# Patient Record
Sex: Male | Born: 1955 | Race: White | Hispanic: No | Marital: Married | Smoking: Never smoker
Health system: Southern US, Community
[De-identification: ages and names within clinical notes are randomized; demographics above are authoritative.]

## PROBLEM LIST (undated history)

## (undated) DIAGNOSIS — E119 Type 2 diabetes mellitus without complications: Secondary | ICD-10-CM

## (undated) DIAGNOSIS — M25559 Pain in unspecified hip: Secondary | ICD-10-CM

## (undated) DIAGNOSIS — I10 Essential (primary) hypertension: Secondary | ICD-10-CM

## (undated) DIAGNOSIS — M5430 Sciatica, unspecified side: Secondary | ICD-10-CM

## (undated) DIAGNOSIS — I219 Acute myocardial infarction, unspecified: Secondary | ICD-10-CM

## (undated) DIAGNOSIS — M549 Dorsalgia, unspecified: Secondary | ICD-10-CM

## (undated) DIAGNOSIS — E7800 Pure hypercholesterolemia, unspecified: Secondary | ICD-10-CM

## (undated) DIAGNOSIS — G8929 Other chronic pain: Secondary | ICD-10-CM

## (undated) HISTORY — DX: Pure hypercholesterolemia, unspecified: E78.00

## (undated) HISTORY — DX: Type 2 diabetes mellitus without complications: E11.9

## (undated) HISTORY — PX: OTHER SURGICAL HISTORY: SHX169

---

## 2003-01-10 ENCOUNTER — Emergency Department (HOSPITAL_COMMUNITY): Admission: EM | Admit: 2003-01-10 | Discharge: 2003-01-10 | Payer: Self-pay | Admitting: Emergency Medicine

## 2004-06-24 ENCOUNTER — Emergency Department (HOSPITAL_COMMUNITY): Admission: EM | Admit: 2004-06-24 | Discharge: 2004-06-24 | Payer: Self-pay | Admitting: Emergency Medicine

## 2008-05-13 ENCOUNTER — Inpatient Hospital Stay (HOSPITAL_COMMUNITY): Admission: AD | Admit: 2008-05-13 | Discharge: 2008-05-15 | Payer: Self-pay | Admitting: Interventional Cardiology

## 2008-05-13 ENCOUNTER — Other Ambulatory Visit: Payer: Self-pay | Admitting: Emergency Medicine

## 2008-06-24 ENCOUNTER — Encounter (HOSPITAL_COMMUNITY): Admission: RE | Admit: 2008-06-24 | Discharge: 2008-09-22 | Payer: Self-pay | Admitting: Interventional Cardiology

## 2008-09-23 ENCOUNTER — Encounter (HOSPITAL_COMMUNITY): Admission: RE | Admit: 2008-09-23 | Discharge: 2008-12-22 | Payer: Self-pay | Admitting: Interventional Cardiology

## 2008-10-01 ENCOUNTER — Ambulatory Visit (HOSPITAL_COMMUNITY): Admission: RE | Admit: 2008-10-01 | Discharge: 2008-10-01 | Payer: Self-pay | Admitting: Internal Medicine

## 2008-10-04 ENCOUNTER — Ambulatory Visit: Payer: Self-pay | Admitting: Orthopedic Surgery

## 2008-10-04 ENCOUNTER — Encounter (INDEPENDENT_AMBULATORY_CARE_PROVIDER_SITE_OTHER): Payer: Self-pay | Admitting: *Deleted

## 2008-10-04 DIAGNOSIS — M549 Dorsalgia, unspecified: Secondary | ICD-10-CM | POA: Insufficient documentation

## 2008-10-04 DIAGNOSIS — M5430 Sciatica, unspecified side: Secondary | ICD-10-CM | POA: Insufficient documentation

## 2008-10-06 ENCOUNTER — Encounter: Payer: Self-pay | Admitting: Orthopedic Surgery

## 2008-10-15 ENCOUNTER — Encounter (INDEPENDENT_AMBULATORY_CARE_PROVIDER_SITE_OTHER): Payer: Self-pay | Admitting: *Deleted

## 2008-10-20 ENCOUNTER — Telehealth: Payer: Self-pay | Admitting: Orthopedic Surgery

## 2008-11-08 ENCOUNTER — Encounter (INDEPENDENT_AMBULATORY_CARE_PROVIDER_SITE_OTHER): Payer: Self-pay | Admitting: *Deleted

## 2009-08-30 ENCOUNTER — Ambulatory Visit (HOSPITAL_COMMUNITY): Admission: RE | Admit: 2009-08-30 | Discharge: 2009-08-30 | Payer: Self-pay | Admitting: General Surgery

## 2010-06-12 ENCOUNTER — Encounter: Payer: Self-pay | Admitting: Family Medicine

## 2010-08-29 LAB — BASIC METABOLIC PANEL
CO2: 29 mEq/L (ref 19–32)
GFR calc Af Amer: >60 mL/min (ref >60)
Glucose, Bld: 112 mg/dL — ABNORMAL HIGH (ref 70–99)
Potassium: 4.2 mEq/L (ref 3.5–5.1)
Sodium: 138 mEq/L (ref 135–145)

## 2010-10-03 NOTE — Cardiovascular Report (Signed)
NAME:  Alexander Avila, Alexander Avila NO.:  [id number]   MEDICAL RECORD NO.:  [id number]           PATIENT TYPE:   LOCATION:                                 FACILITY:   PHYSICIAN:  Lyn Records, M.D.   DATE OF BIRTH:  [birth year]   DATE OF PROCEDURE:  DATE OF DISCHARGE:                            CARDIAC CATHETERIZATION   REASON FOR ADMISSION:  Acute inferior STEMI.   SUBJECTIVE:  The patient is a 55 year old gentleman who works for [employer].  He began having severe chest discomfort at 2:00 p.m.  He was  brought by [facility] to the emergency room where ST elevation was  noted in the inferior leads.  He stated that the discomfort was  associated with nausea and diaphoresis.  The pain has been unrelenting.  There is no prior history of heart disease.   HABITS:  Does not smoke.  May drink up to 6 beers per week.   MEDICATIONS:  Lisinopril and Pravachol.   ALLERGIES:  PENICILLIN.   FAMILY HISTORY:  Negative for CAD.   PREVIOUS SURGERIES:  None.   SIGNIFICANT MEDICAL PROBLEMS:  1. Hypertension  2. Hyperlipidemia.   REVIEW OF SYSTEMS:  Unremarkable.   OBJECTIVE:  VITAL SIGNS:  Blood pressure 140/100, heart rate 80.  SKIN:  Damp.  HEENT:  Unremarkable.  LUNGS:  Clear to auscultation and percussion.  CARDIAC:  No rub.  No murmur.  No click.  ABDOMEN:  Soft.  Liver and spleen not palpable.  Bowel sounds are  normal.  EXTREMITIES:  No edema.  The pulses are 2+ and symmetric.  Femorals  reveal no bruits.  NEUROLOGIC:  Unremarkable.   EKG, inferior ST elevation.   ASSESSMENT:  Acute inferior wall ST-segment elevation myocardial  infarction.   PLAN:  Cardiac catheterization laboratory immediately for reperfusion  therapy.      Lyn Records, M.D.  Electronically Signed     HWS/MEDQ  D:  05/13/2008  T:  05/14/2008  Job:  [id number]   cc:   [facility] and [facility]

## 2010-10-03 NOTE — Cardiovascular Report (Signed)
NAME:  Alexander Avila, Alexander Avila NO.:  [id number]   MEDICAL RECORD NO.:  [id number]          PATIENT TYPE:  OIB   LOCATION:  2307                         FACILITY:  [facility]   PHYSICIAN:  Lyn Records, M.D.   DATE OF BIRTH:  1955-09-14   DATE OF PROCEDURE:  05/13/2008  DATE OF DISCHARGE:                            CARDIAC CATHETERIZATION   REASON FOR PROCEDURE:  Acute inferior wall STEMI starting at 2 p.m.  The  patient was brought to the emergency room with diagnostic inferior ST-  segment elevation and 5-7/10 chest pain.   PROCEDURES PERFORMED:  1. Left heart cath.  2. Selective coronary angiography.  3. Left ventriculography.  4. Drug-eluting stent implantation, distal circumflex.  5. Drug-eluting stent implantation, fourth obtuse marginal branch.   DESCRIPTION:  After initial brief evaluation in the emergency room, the  patient was brought to the cath lab where percutaneous coronary  intervention was performed.  A 6-French sheath was placed in the right  femoral artery after 1 mg of Versed and 25 mcg of fentanyl.  This 6-  [place] sheath was then used to deliver a 6-French A2 multipurpose  catheter, which was used for hemodynamic recordings, left  ventriculography by hand injection, and selective left and right  coronary angiography.  We identified the distal circumflex as a source  of the patient's acute inferior infarction.  His right coronary was  nondominant.  We also identified severe disease and a large  inferolateral obtuse marginal branch.   We used a CLS 6-French guide catheter with 3.5 cm curve.  The patient  received 0.75 mg/kg bolus followed by a 1.75 mg/kg/hour infusion.  ACT  was documented to be greater than 300 seconds.  PCI was performed to the  CLS 3.5 6-French left system guide catheter.  An Office manager was  used across the stenosis in the distal right coronary that supplied the  PDA territory.  Wire reperfusion TIMI grade 3 flow with  abrupt  resolution of ST-segment elevation occurred at 4:06 p.m.  The patient  then developed significant Bezold-Jarisch reaction with bradycardia and  severe hypotension requiring IV fluid administration and atropine.  We  did not perform any balloon inflation until the patient's hemodynamics  were stabilized.  We then performed angioplasty using a 2.5- x 12-mm  long apex balloon and then deployed a 18-mm long x 2.75-mm Xience V  stent.  We postdilated with a 12-mm long x 3.0-mm diameter Quantum  balloon.   After high-pressure postdilatation, we then turned our attention to the  fourth obtuse marginal.  We used the [employer] guidewire.  We  predilated with the same 2.5-mm x 12-mm long apex.  We deployed a 23-mm  long x 2.75-mm Xience V stent.  Post dilatation with the 12-mm long x  3.0-mm Quantum was performed in the distal, mid, and proximal stent.  Peak pressure was 13 atmospheres.  The case was then terminated and a  good visual result with TIMI grade 3 flow noted in all 3 vessels.  The  patient was comfortable throughout the majority of the procedure as his  chest pain significantly resolved and ST segments resolved after wire  reperfusion at 4 4:02 p.m.   Angio-Seal was used to achieve hemostasis.  No complications occurred  during the procedure.   RESULTS:  1. Hemodynamic data:      a.     Aortic pressure 124/86.      b.     Left ventricular pressure 129/14.  2. Left ventriculography:  Severe inferior wall hypokinesis as noted.      EF is 55%.  3. Coronary angiography.      a.     Left main coronary:  Short and widely patent.      b.     Left anterior descending coronary:  Large arising from the       near single ostium.  The LAD wraps around the left ventricular       apex.  Two small diagonal branches are noted.  Midvessel       irregularities are noted, but no significant obstruction is seen.      c.     Circumflex artery:  The circumflex also arises from a near        single/separate ostium.  It gives origin to 3 relatively small and       diffusely diseased obtuse marginal branches followed by very large       fourth obtuse marginal branch that contains a 95% stenosis.  The       continuation of the circumflex beyond the fourth obtuse marginal       is totally occluded (it is thrombotic).  The continuation of the       circumflex supplies the PDA.      d.     The right coronary:  The right coronary is nondominant that       contains severe disease in the proximal and mid segment up to 80%.       One acute marginal and a conus branch arise from the right       coronary.  4. PCI.      a.     PCI of the distal circumflex resulted in recanalization of       100% occlusion and reestablished flow into the PDA with reduction       of stenosis to 0% with TIMI grade 3 flow.  TIMI grade 3 flow,       however, was achieved with wire reperfusion at 4:02 p.m.      b.     The fourth obtuse marginal branch was successfully stented       reducing a 95% stenosis to 0% with TIMI grade 3 flow.   CONCLUSIONS:  1. Successful therapy inferior ST-elevated myocardial infarction with      drug-eluting stenting of the distal circumflex/posterior descending      artery with a Xience V drug-eluting stent.  2. Successful percutaneous coronary intervention of 95% stenosis in a      large fourth obtuse marginal to 0% with Xience V drug-eluting      stent.  3. Wide patency of the left anterior descending.  4. Severe diffuse disease in small nondominant right coronary.  5. Left ventricular dysfunction with moderate inferior wall      hypokinesis, ejection fraction of 55%.   PLAN:  Aspirin.  Plavix.  Angiomax had low infusion rate of 0.25 mg/kg/2  hours.  Plavix and aspirin will be continued for a year.  ACE inhibitor  and beta-blocker therapy will be used  as tolerated by blood pressure.      Lyn Records, M.D.  Electronically Signed     HWS/MEDQ  D:  05/13/2008  T:   05/14/2008  Job:  [id number]   cc:   [facility], [facility]

## 2011-01-14 ENCOUNTER — Emergency Department (HOSPITAL_COMMUNITY)
Admission: EM | Admit: 2011-01-14 | Discharge: 2011-01-15 | Disposition: A | Discharge status: Home / Self Care | Payer: BC Managed Care – PPO | Attending: Emergency Medicine | Admitting: Emergency Medicine

## 2011-01-14 ENCOUNTER — Other Ambulatory Visit: Payer: Self-pay

## 2011-01-14 DIAGNOSIS — I252 Old myocardial infarction: Secondary | ICD-10-CM | POA: Insufficient documentation

## 2011-01-14 DIAGNOSIS — I10 Essential (primary) hypertension: Secondary | ICD-10-CM

## 2011-01-14 HISTORY — DX: Acute myocardial infarction, unspecified: I21.9

## 2011-01-14 HISTORY — DX: Essential (primary) hypertension: I10

## 2011-01-14 LAB — CBC
HCT: 44.5 % (ref 39.0–52.0)
Hemoglobin: 15.6 g/dL (ref 13.0–17.0)
MCH: 31 pg (ref 26.0–34.0)
MCHC: 35.1 g/dL (ref 30.0–36.0)
RBC: 5.04 MIL/uL (ref 4.22–5.81)

## 2011-01-14 LAB — DIFFERENTIAL
Basophils Relative: 0 % (ref 0–1)
Lymphs Abs: 2.6 10*3/uL (ref 0.7–4.0)
Monocytes Absolute: 0.5 10*3/uL (ref 0.1–1.0)
Monocytes Relative: 7 % (ref 3–12)
Neutro Abs: 4.4 10*3/uL (ref 1.7–7.7)
Neutrophils Relative %: 57 % (ref 43–77)

## 2011-01-14 NOTE — ED Notes (Signed)
Pt denies cp, or sob. Been having spells of anxitity & anxious. Pt has episode of sweating & unable to cool down. Pt did take extra bp pill before coming to the er.

## 2011-01-14 NOTE — ED Provider Notes (Signed)
History     CSN: [id number] Arrival date & time: 01/14/2011 10:33 PM  Chief Complaint  Patient presents with  . Hypertension    hot and sweaty   HPI Comments: Pt reports 3 episodes in past month They last several hours Not exertional No syncope No Ha No visual change No cp/sob No focal weakness No abd pain He reports episodes of diaphoresis, anxiety and elevated BP.  This is the third episode in past month No arm pain.  No pallor reported  He reports that he had an MI about two yrs ago, and the symptoms at that time consisted of sweating, paleness, epigastric discomfort and left UE numbness.  He does not have any arm numbness and no abd pain these times.   He reports he is active, worked on his yard earlier today without any symptoms or weakness at that time.   He reports BP med compliance   Patient is a 55 y.o. male presenting with hypertension. The history is provided by the patient and the spouse.  Hypertension This is a recurrent problem. The current episode started less than 1 hour ago. The problem has been gradually improving. Pertinent negatives include no chest pain, no abdominal pain, no headaches and no shortness of breath. The symptoms are aggravated by nothing. The symptoms are relieved by nothing.    Past Medical History  Diagnosis Date  . Hypertension   . Heart attack     Past Surgical History  Procedure Date  . Stents     No family history on file.  History  Substance Use Topics  . Smoking status: Never Smoker   . Smokeless tobacco: Not on file  . Alcohol Use: Yes     occ      Review of Systems  Respiratory: Negative for shortness of breath.   Cardiovascular: Negative for chest pain.  Gastrointestinal: Negative for abdominal pain.  Neurological: Negative for headaches.  All other systems reviewed and are negative.    Physical Exam  BP 151/85  Pulse 82  Temp(Src) 97.8 F (36.6 C) (Oral)  Resp 16  Ht 6' (1.829 m)  Wt 255 lb (115.667  kg)  BMI 34.58 kg/m2  SpO2 98%  Physical Exam  CONSTITUTIONAL: Well developed/well nourished HEAD AND FACE: Normocephalic/atraumatic EYES: EOMI/PERRL ENMT: Mucous membranes moist NECK: supple no meningeal signs CV: S1/S2 noted, no murmurs/rubs/gallops noted LUNGS: Lungs are clear to auscultation bilaterally, no apparent distress ABDOMEN: soft, nontender, no rebound or guarding NEURO: Pt is awake/alert, moves all extremitiesx4 EXTREMITIES: pulses normal, full ROM SKIN: warm, color normal, no sweat noted on skin PSYCH: no abnormalities of mood noted   ED Course  Procedures  MDM Nursing notes reviewed and considered in documentation All labs/vitals reviewed and considered    Date: 01/14/2011  Rate: 77  Rhythm: normal sinus rhythm  QRS Axis: normal  Intervals: normal  ST/T Wave abnormalities: normal  Conduction Disutrbances:none  Narrative Interpretation:   Old EKG Reviewed: unchanged  Pt well appearing.  Patient and wife request that I run one set of cardiac markers to r/o any sort of cardiac event.  I informed them that normal EKG and cardiac marker do not completely r/o ACS, they understand this, but still want labs and to f/u with PCP tomorrow.     12:20 AM Pt well appearing, BP improved No further symptoms while in ED He will see his PCP tomorrow for further workup/evaluation He requested ativan before d/c home     Joya Gaskins, MD  01/15/11 0021 

## 2011-01-14 NOTE — ED Notes (Signed)
Pt reports having 3 episodes of feeling hot and sweaty over the last month, increased anxiety tonight bp was elevated at home.

## 2011-01-15 LAB — BASIC METABOLIC PANEL
BUN: 21 mg/dL (ref 6–23)
CO2: 27 mEq/L (ref 19–32)
Calcium: 9 mg/dL (ref 8.4–10.5)
Chloride: 104 mEq/L (ref 96–112)
Creatinine, Ser: 0.99 mg/dL (ref 0.50–1.35)
GFR calc Af Amer: >60 mL/min (ref >60)
GFR calc non Af Amer: >60 mL/min (ref >60)
Glucose, Bld: 156 mg/dL — ABNORMAL HIGH (ref 70–99)
Potassium: 3.8 mEq/L (ref 3.5–5.1)
Sodium: 139 mEq/L (ref 135–145)

## 2011-01-15 MED ORDER — LORAZEPAM 1 MG PO TABS
1.0000 mg | ORAL_TABLET | Freq: Once | ORAL | Status: AC
  Administered 2011-01-15: 1 mg via ORAL
  Filled 2011-01-15: qty 1

## 2011-02-23 LAB — DIFFERENTIAL
Basophils Relative: 0 % (ref 0–1)
Eosinophils Absolute: 0 10*3/uL (ref 0.0–0.7)
Eosinophils Relative: 0 % (ref 0–5)
Lymphs Abs: 1.8 10*3/uL (ref 0.7–4.0)

## 2011-02-23 LAB — POCT I-STAT, CHEM 8
BUN: 17 mg/dL (ref 6–23)
Chloride: 101 mEq/L (ref 96–112)
Creatinine, Ser: 0.9 mg/dL (ref 0.4–1.5)
Glucose, Bld: 175 mg/dL — ABNORMAL HIGH (ref 70–99)
Potassium: 3.7 mEq/L (ref 3.5–5.1)
Sodium: 140 mEq/L (ref 135–145)

## 2011-02-23 LAB — BASIC METABOLIC PANEL
BUN: 11 mg/dL (ref 6–23)
CO2: 27 mEq/L (ref 19–32)
Chloride: 105 mEq/L (ref 96–112)
Chloride: 107 mEq/L (ref 96–112)
GFR calc Af Amer: >60 mL/min (ref >60)
GFR calc non Af Amer: >60 mL/min (ref >60)
GFR calc non Af Amer: >60 mL/min (ref >60)
Glucose, Bld: 108 mg/dL — ABNORMAL HIGH (ref 70–99)
Potassium: 3.9 mEq/L (ref 3.5–5.1)
Potassium: 3.9 mEq/L (ref 3.5–5.1)
Sodium: 141 mEq/L (ref 135–145)

## 2011-02-23 LAB — CARDIAC PANEL(CRET KIN+CKTOT+MB+TROPI)
CK, MB: 4.3 ng/mL — ABNORMAL HIGH (ref 0.3–4.0)
CK, MB: 75.6 ng/mL — ABNORMAL HIGH (ref 0.3–4.0)
Relative Index: 14.6 — ABNORMAL HIGH (ref 0.0–2.5)
Relative Index: 2.3 (ref 0.0–2.5)
Total CK: 191 U/L (ref 7–232)

## 2011-02-23 LAB — CBC
HCT: 43.5 % (ref 39.0–52.0)
Hemoglobin: 14.3 g/dL (ref 13.0–17.0)
Hemoglobin: 14.4 g/dL (ref 13.0–17.0)
MCHC: 33.1 g/dL (ref 30.0–36.0)
MCHC: 33.5 g/dL (ref 30.0–36.0)
MCV: 94.6 fL (ref 78.0–100.0)
Platelets: 225 10*3/uL (ref 150–400)
RBC: 4.53 MIL/uL (ref 4.22–5.81)
RDW: 12 % (ref 11.5–15.5)
WBC: 9.9 10*3/uL (ref 4.0–10.5)

## 2011-02-23 LAB — PROTIME-INR: INR: 1 (ref 0.00–1.49)

## 2011-02-23 LAB — COMPREHENSIVE METABOLIC PANEL
ALT: 32 U/L (ref 0–53)
AST: 26 U/L (ref 0–37)
Alkaline Phosphatase: 44 U/L (ref 39–117)
CO2: 29 mEq/L (ref 19–32)
Calcium: 8.5 mg/dL (ref 8.4–10.5)
Chloride: 106 mEq/L (ref 96–112)
GFR calc Af Amer: >60 mL/min (ref >60)
GFR calc non Af Amer: >60 mL/min (ref >60)
Glucose, Bld: 176 mg/dL — ABNORMAL HIGH (ref 70–99)
Potassium: 3.5 mEq/L (ref 3.5–5.1)
Sodium: 140 mEq/L (ref 135–145)

## 2011-02-23 LAB — TSH: TSH: 0.687 u[IU]/mL (ref 0.350–4.500)

## 2011-02-23 LAB — HEMOGLOBIN A1C: Mean Plasma Glucose: 123 mg/dL

## 2011-02-23 LAB — CK TOTAL AND CKMB (NOT AT ARMC): Relative Index: 4.8 — ABNORMAL HIGH (ref 0.0–2.5)

## 2011-02-23 LAB — LIPID PANEL
Cholesterol: 120 mg/dL (ref 0–200)
Total CHOL/HDL Ratio: 3.8 RATIO
VLDL: 17 mg/dL (ref 0–40)

## 2011-02-23 LAB — APTT: aPTT: 31 seconds (ref 24–37)

## 2013-09-28 ENCOUNTER — Encounter: Payer: Self-pay | Admitting: Interventional Cardiology

## 2013-09-28 ENCOUNTER — Ambulatory Visit (INDEPENDENT_AMBULATORY_CARE_PROVIDER_SITE_OTHER): Payer: BC Managed Care – PPO | Admitting: Interventional Cardiology

## 2013-09-28 VITALS — BP 130/84 | HR 64 | Ht 72.0 in | Wt 288.8 lb

## 2013-09-28 DIAGNOSIS — E782 Mixed hyperlipidemia: Secondary | ICD-10-CM | POA: Insufficient documentation

## 2013-09-28 DIAGNOSIS — I10 Essential (primary) hypertension: Secondary | ICD-10-CM | POA: Insufficient documentation

## 2013-09-28 DIAGNOSIS — I2510 Atherosclerotic heart disease of native coronary artery without angina pectoris: Secondary | ICD-10-CM

## 2013-09-28 DIAGNOSIS — E785 Hyperlipidemia, unspecified: Secondary | ICD-10-CM

## 2013-09-28 NOTE — Progress Notes (Signed)
Patient ID: Alexander Avila, male   DOB: 1955-07-24, 58 y.o.   MRN: [id number]    [address] [place], [place]  [address] Phone: [phone] Fax:  [phone]  Date:  09/28/2013   ID:  Alexander Avila, DOB [birth year], MRN [id number]  PCP:  No primary provider on file.   ASSESSMENT:  1. Coronary artery disease with obtuse marginal 3 and 4 drug-eluting stent 05/13/2008 2. Hyperlipidemia 3. Hypertension under control  PLAN:  1. Continue aerobic activity 2. Weight loss 3. Continue treatment of elevated lipids 4. Clinical followup in one year   SUBJECTIVE: Alexander Avila is a 58 y.o. male who has no symptoms. He had angina and received obtuse marginal stents in 2009. No recurrence of symptoms since that time. An exercise treadmill test year ago did not reveal any evidence of ischemia. He has remained relatively active since that time.   Wt Readings from Last 3 Encounters:  09/28/13 288 lb 12.8 oz (130.999 kg)  01/14/11 255 lb (115.667 kg)  10/04/08 225 lb (102.059 kg)     Past Medical History  Diagnosis Date  . Hypertension   . Heart attack     Current Outpatient Prescriptions  Medication Sig Dispense Refill  . aspirin 325 MG tablet Take 325 mg by mouth daily.        Marland Kitchen atorvastatin (LIPITOR) 20 MG tablet Take 1 tablet by mouth daily.      Marland Kitchen escitalopram (LEXAPRO) 20 MG tablet Take 1 tablet by mouth daily.      Marland Kitchen lisinopril-hydrochlorothiazide (PRINZIDE,ZESTORETIC) 20-12.5 MG per tablet Take 1 tablet by mouth daily.        Marland Kitchen losartan-hydrochlorothiazide (HYZAAR) 50-12.5 MG per tablet Take 1 tablet by mouth daily.        . metFORMIN (GLUCOPHAGE) 1000 MG tablet Take 1 tablet by mouth daily.      . nitroGLYCERIN (NITROSTAT) 0.4 MG SL tablet Place 0.4 mg under the tongue every 5 (five) minutes as needed for chest pain.      Marland Kitchen QSYMIA 7.5-46 MG CP24 Take 1 tablet by mouth daily.      . Vitamin D, Ergocalciferol, (DRISDOL) 50000 UNITS CAPS capsule Take 1 capsule  by mouth once a week.       No current facility-administered medications for this visit.    Allergies:   No Known Allergies  Social History:  The patient  reports that he has never smoked. He does not have any smokeless tobacco history on file. He reports that he drinks alcohol. He reports that he does not use illicit drugs.   ROS:  Please see the history of present illness.   No medication side effects   All other systems reviewed and negative.   OBJECTIVE: VS:  BP 130/84  Pulse 64  Ht 6' (1.829 m)  Wt 288 lb 12.8 oz (130.999 kg)  BMI 39.16 kg/m2 Well nourished, well developed, in no acute distress, obese HEENT: normal Neck: JVD flat. Carotid bruit absent  Cardiac:  normal S1, S2; RRR; no murmur Lungs:  clear to auscultation bilaterally, no wheezing, rhonchi or rales Abd: soft, nontender, no hepatomegaly Ext: Edema absent. Pulses 2+ Skin: warm and dry Neuro:  CNs 2-12 intact, no focal abnormalities noted  EKG:  Normal       Signed, Illene Labrador III, MD 09/28/2013 2:26 PM

## 2013-09-28 NOTE — Patient Instructions (Signed)
Your physician recommends that you continue on your current medications as directed. Please refer to the Current Medication list given to you today.  Your physician discussed the importance of regular exercise and recommended that you start or continue a regular exercise program for good health.  Your physician wants you to follow-up in: 1 year with Dr.Smith You will receive a reminder letter in the mail two months in advance. If you don't receive a letter, please call our office to schedule the follow-up appointment.  

## 2015-02-14 ENCOUNTER — Emergency Department (HOSPITAL_COMMUNITY): Payer: BLUE CROSS/BLUE SHIELD

## 2015-02-14 ENCOUNTER — Emergency Department (HOSPITAL_COMMUNITY)
Admission: EM | Admit: 2015-02-14 | Discharge: 2015-02-14 | Disposition: A | Discharge status: Home / Self Care | Payer: BLUE CROSS/BLUE SHIELD | Attending: Emergency Medicine | Admitting: Emergency Medicine

## 2015-02-14 ENCOUNTER — Encounter (HOSPITAL_COMMUNITY): Payer: Self-pay | Admitting: *Deleted

## 2015-02-14 DIAGNOSIS — Y9383 Activity, rough housing and horseplay: Secondary | ICD-10-CM | POA: Insufficient documentation

## 2015-02-14 DIAGNOSIS — S76219A Strain of adductor muscle, fascia and tendon of unspecified thigh, initial encounter: Secondary | ICD-10-CM

## 2015-02-14 DIAGNOSIS — Y9289 Other specified places as the place of occurrence of the external cause: Secondary | ICD-10-CM | POA: Insufficient documentation

## 2015-02-14 DIAGNOSIS — X58XXXA Exposure to other specified factors, initial encounter: Secondary | ICD-10-CM | POA: Diagnosis not present

## 2015-02-14 DIAGNOSIS — S76911A Strain of unspecified muscles, fascia and tendons at thigh level, right thigh, initial encounter: Secondary | ICD-10-CM | POA: Diagnosis not present

## 2015-02-14 DIAGNOSIS — Z7982 Long term (current) use of aspirin: Secondary | ICD-10-CM | POA: Insufficient documentation

## 2015-02-14 DIAGNOSIS — Z79899 Other long term (current) drug therapy: Secondary | ICD-10-CM | POA: Insufficient documentation

## 2015-02-14 DIAGNOSIS — I10 Essential (primary) hypertension: Secondary | ICD-10-CM | POA: Diagnosis not present

## 2015-02-14 DIAGNOSIS — S79911A Unspecified injury of right hip, initial encounter: Secondary | ICD-10-CM | POA: Diagnosis present

## 2015-02-14 DIAGNOSIS — Y998 Other external cause status: Secondary | ICD-10-CM | POA: Diagnosis not present

## 2015-02-14 MED ORDER — HYDROCODONE-ACETAMINOPHEN 5-325 MG PO TABS: 2.0000 | ORAL_TABLET | ORAL | Status: DC | PRN

## 2015-02-14 MED ORDER — DICLOFENAC SODIUM 75 MG PO TBEC: 75.0000 mg | DELAYED_RELEASE_TABLET | Freq: Two times a day (BID) | ORAL | Status: DC

## 2015-02-14 MED ORDER — METHOCARBAMOL 500 MG PO TABS: 500.0000 mg | ORAL_TABLET | Freq: Two times a day (BID) | ORAL | Status: DC

## 2015-02-14 NOTE — ED Notes (Signed)
Pt back into room from Xray

## 2015-02-14 NOTE — ED Provider Notes (Signed)
CSN: [id number]     Arrival date & time 02/14/15  1213 History  This chart was scribed for non-physician practitioner Caryl Ada, PA-C working with Ezequiel Essex, MD by Meriel Pica, ED Scribe. This patient was seen in room APFT24/APFT24 and the patient's care was started at 1:09 PM.   Chief Complaint  Patient presents with  . Hip Pain   The history is provided by the patient. No language interpreter was used.   HPI Comments: Alexander Avila is a 59 y.o. male, with a PMhx of HTN, who presents to the Emergency Department complaining of sudden onset, sharp right hip pain with movement onset 2 days ago. The pt reports he was horseplaying on Saturday and his right leg was extended beyond his normal ROM. He notes worsening of the pain when he first stands up but denies exacerbation of pain with ambulating. He also notes pain in right groin region. Pt denies any PMhx of arthritis.  Past Medical History  Diagnosis Date  . Hypertension   . Heart attack    Past Surgical History  Procedure Laterality Date  . Stents     Family History  Problem Relation Age of Onset  . Other Father    Social History  Substance Use Topics  . Smoking status: Never Smoker   . Smokeless tobacco: None  . Alcohol Use: Yes     Comment: occ    Review of Systems  Musculoskeletal: Positive for arthralgias ( right hip, right groin). Negative for gait problem.  All other systems reviewed and are negative.  Allergies  Review of patient's allergies indicates no known allergies.  Home Medications   Prior to Admission medications   Medication Sig Start Date End Date Taking? Authorizing Aisa Schoeppner  aspirin 325 MG tablet Take 325 mg by mouth daily.     Yes Historical Keyani Rigdon, MD  atorvastatin (LIPITOR) 20 MG tablet Take 1 tablet by mouth daily. 08/26/13  Yes Historical Noriah Osgood, MD  escitalopram (LEXAPRO) 20 MG tablet Take 1 tablet by mouth daily. 08/26/13  Yes Historical Maripaz Mullan, MD  ibuprofen (ADVIL,MOTRIN) 200  MG tablet Take 400 mg by mouth every 6 (six) hours as needed.   Yes Historical Lakely Elmendorf, MD  lisinopril-hydrochlorothiazide (PRINZIDE,ZESTORETIC) 20-12.5 MG per tablet Take 1 tablet by mouth daily.     Yes Historical Jerl Munyan, MD  losartan-hydrochlorothiazide (HYZAAR) 50-12.5 MG per tablet Take 1 tablet by mouth daily.     Yes Historical Akshith Moncus, MD  metFORMIN (GLUCOPHAGE) 1000 MG tablet Take 1 tablet by mouth daily. 07/22/13  Yes Historical Galdino Hinchman, MD  Multiple Vitamin (ONE-A-DAY MENS PO) Take 1 tablet by mouth daily.   Yes Historical Sahira Cataldi, MD  nitroGLYCERIN (NITROSTAT) 0.4 MG SL tablet Place 0.4 mg under the tongue every 5 (five) minutes as needed for chest pain.   Yes Historical Leiloni Smithers, MD  Vitamin D, Ergocalciferol, (DRISDOL) 50000 UNITS CAPS capsule Take 1 capsule by mouth once a week. 09/07/13  Yes Historical Oaklyn Jakubek, MD   BP 164/104 mmHg  Pulse 76  Temp(Src) 97.8 F (36.6 C) (Oral)  Resp 18  Ht 6' (1.829 m)  Wt 270 lb (122.471 kg)  BMI 36.61 kg/m2  SpO2 98% Physical Exam  Constitutional: He is oriented to person, place, and time. He appears well-developed and well-nourished. No distress.  HENT:  Head: Normocephalic.  Eyes: Conjunctivae are normal.  Neck: Normal range of motion. Neck supple.  Cardiovascular: Normal rate.   Pulmonary/Chest: Effort normal. No respiratory distress.  Musculoskeletal: Normal range of motion. He exhibits  tenderness.  Tender right hip and right groin.  Neurological: He is alert and oriented to person, place, and time. Coordination normal.  Skin: Skin is warm.  Psychiatric: He has a normal mood and affect. His behavior is normal.  Nursing note and vitals reviewed.   ED Course  Procedures  DIAGNOSTIC STUDIES: Oxygen Saturation is 98% on RA, normal by my interpretation.    COORDINATION OF CARE: 1:12 PM Discussed treatment plan with pt at bedside and pt agreed to plan. Will order Xray of right hip.   Imaging Review Dg Hip Unilat With  Pelvis 2-3 Views Right  02/14/2015   CLINICAL DATA:  Acute onset pain 2 days prior after exaggerated flexion motion of the right hip joint  EXAM: DG HIP (WITH OR WITHOUT PELVIS) 2-3V RIGHT  COMPARISON:  None.  FINDINGS: Frontal pelvis as well as frontal and lateral right hip images obtained. There is no demonstrable fracture or dislocation. No appreciable joint space narrowing. No erosive change.  IMPRESSION: No fracture or dislocation.  No appreciable arthropathic change.   Electronically Signed   By: Lowella Grip III M.D.   On: 02/14/2015 13:55   I have personally reviewed and evaluated these images as part of my medical decision-making.   MDM   Final diagnoses:  Groin strain, initial encounter   Meds ordered this encounter  Medications  . Multiple Vitamin (ONE-A-DAY MENS PO)    Sig: Take 1 tablet by mouth daily.  Marland Kitchen ibuprofen (ADVIL,MOTRIN) 200 MG tablet    Sig: Take 400 mg by mouth every 6 (six) hours as needed.  . methocarbamol (ROBAXIN) 500 MG tablet    Sig: Take 1 tablet (500 mg total) by mouth 2 (two) times daily.    Dispense:  20 tablet    Refill:  0    Order Specific Question:  Supervising Gertrue Willette    Answer:  MILLER, BRIAN [[id number]]  . diclofenac (VOLTAREN) 75 MG EC tablet    Sig: Take 1 tablet (75 mg total) by mouth 2 (two) times daily.    Dispense:  20 tablet    Refill:  0    Order Specific Question:  Supervising Salima Rumer    Answer:  MILLER, BRIAN [[id number]]  . HYDROcodone-acetaminophen (NORCO/VICODIN) 5-325 MG per tablet    Sig: Take 2 tablets by mouth every 4 (four) hours as needed.    Dispense:  20 tablet    Refill:  0    Order Specific Question:  Supervising Kristi Hyer    Answer:  Noemi Chapel [[id number]]  Follow up with Dr. Luna Glasgow for evaluation if pain persist past one week.  Hollace Kinnier [place], PA-C 02/14/15 [place], MD 02/14/15 [phone]

## 2015-02-14 NOTE — Discharge Instructions (Signed)
Groin Strain °A groin strain (also called a groin pull) is an injury to the muscles or tendon on the upper inner part of the thigh. These muscles are called the adductor muscles or groin muscles. They are responsible for moving the leg across the body. A muscle strain occurs when a muscle is overstretched and some muscle fibers are torn. A groin strain can range from mild to severe depending on how many muscle fibers are affected and whether the muscle fibers are partially or completely torn.  °Groin strains usually occur during exercise or participation in sports. The injury often happens when a sudden, violent force is placed on a muscle, stretching the muscle too far. A strain is more likely to occur when your muscles are not warmed up or if you are not properly conditioned. Depending on the severity of the groin strain, recovery time may vary from a few weeks to several weeks. Severe injuries often require 4-6 weeks for recovery. In these cases, complete healing can take 4-5 months.  °CAUSES  °· Stretching the groin muscles too far or too suddenly, often during side-to-side motion with an abrupt change in direction. °· Putting repeated stress on the groin muscles over a long period of time. °· Performing vigorous activity without properly stretching the groin muscles beforehand. °SYMPTOMS  °· Pain and tenderness in the groin area. This begins as sharp pain and persists as a dull ache. °· Popping or snapping feeling when the injury occurs (for severe strains). °· Swelling or bruising. °· Muscle spasms. °· Weakness in the leg. °· Stiffness in the groin area with decreased ability to move the affected muscles. °DIAGNOSIS  °Your caregiver will perform a physical exam to diagnose a groin strain. You will be asked about your symptoms and how the injury occurred. X-rays are sometimes needed to rule out a broken bone or cartilage problems. Your caregiver may order a CT scan or MRI if a complete muscle tear is  suspected. °TREATMENT  °A groin strain will often heal on its own. Your caregiver may prescribe medicines to help manage pain and swelling (anti-inflammatory medicine). You may be told to use crutches for the first few days to minimize your pain. °HOME CARE INSTRUCTIONS  °· Rest. Do not use the strained muscle if it causes pain. °· Put ice on the injured area. °¨ Put ice in a plastic bag. °¨ Place a towel between your skin and the bag. °¨ Leave the ice on for 15-20 minutes, every 2-3 hours. Do this for the first 2 days after the injury.  °· Only take over-the-counter or prescription medicines as directed by your caregiver. °· Wrap the injured area with an elastic bandage as directed by your caregiver. °· Keep the injured leg raised (elevated). °· Walk, stretch, and perform range-of-motion exercises to improve blood flow to the injured area. Only perform these activities if you can do so without any pain. °To prevent muscle strains: °· Warm up before exercise. °· Develop proper conditioning and strength in the groin muscles. °SEEK IMMEDIATE MEDICAL CARE IF:  °· You have increased pain or swelling in the affected area.   °· Your symptoms are not improving or are getting worse. °MAKE SURE YOU:  °· Understand these instructions. °· Will watch your condition. °· Will get help right away if you are not doing well or get worse. °Document Released: 01/03/2004 Document Revised: 04/23/2012 Document Reviewed: 01/09/2012 °[employer]® Patient Information ©2015 [employer]. This information is not intended to replace advice given to you   by your health care provider. Make sure you discuss any questions you have with your health care provider. ° °

## 2015-02-14 NOTE — ED Notes (Signed)
Discharge instructions reviewed with pt, Discussed med use -- And importance of eating something when taking the pain meds  And fact can make him sleepy- Verbalized understanding and declined offer of work note .Ambulated off unit independently

## 2015-02-14 NOTE — ED Notes (Signed)
Pt c/o right hip pain after "horse playing" on Saturday

## 2015-08-28 ENCOUNTER — Encounter (HOSPITAL_COMMUNITY): Payer: Self-pay | Admitting: Emergency Medicine

## 2015-08-28 ENCOUNTER — Emergency Department (HOSPITAL_COMMUNITY)
Admission: EM | Admit: 2015-08-28 | Discharge: 2015-08-28 | Disposition: A | Discharge status: Home / Self Care | Payer: BLUE CROSS/BLUE SHIELD | Attending: Emergency Medicine | Admitting: Emergency Medicine

## 2015-08-28 ENCOUNTER — Emergency Department (HOSPITAL_COMMUNITY): Payer: BLUE CROSS/BLUE SHIELD

## 2015-08-28 DIAGNOSIS — I10 Essential (primary) hypertension: Secondary | ICD-10-CM | POA: Insufficient documentation

## 2015-08-28 DIAGNOSIS — Z791 Long term (current) use of non-steroidal anti-inflammatories (NSAID): Secondary | ICD-10-CM | POA: Diagnosis not present

## 2015-08-28 DIAGNOSIS — J9801 Acute bronchospasm: Secondary | ICD-10-CM | POA: Insufficient documentation

## 2015-08-28 DIAGNOSIS — Z7984 Long term (current) use of oral hypoglycemic drugs: Secondary | ICD-10-CM | POA: Insufficient documentation

## 2015-08-28 DIAGNOSIS — J209 Acute bronchitis, unspecified: Secondary | ICD-10-CM

## 2015-08-28 DIAGNOSIS — Z79899 Other long term (current) drug therapy: Secondary | ICD-10-CM | POA: Insufficient documentation

## 2015-08-28 DIAGNOSIS — R05 Cough: Secondary | ICD-10-CM | POA: Diagnosis present

## 2015-08-28 DIAGNOSIS — Z7982 Long term (current) use of aspirin: Secondary | ICD-10-CM | POA: Insufficient documentation

## 2015-08-28 MED ORDER — PREDNISONE 10 MG PO TABS
60.0000 mg | ORAL_TABLET | Freq: Once | ORAL | Status: AC
  Administered 2015-08-28: 60 mg via ORAL
  Filled 2015-08-28: qty 1

## 2015-08-28 MED ORDER — HYDROCODONE-HOMATROPINE 5-1.5 MG/5ML PO SYRP: 5.0000 mL | ORAL_SOLUTION | Freq: Four times a day (QID) | ORAL | Status: DC | PRN

## 2015-08-28 MED ORDER — PROMETHAZINE-CODEINE 6.25-10 MG/5ML PO SYRP: 5.0000 mL | ORAL_SOLUTION | ORAL | Status: DC | PRN

## 2015-08-28 MED ORDER — ALBUTEROL SULFATE (2.5 MG/3ML) 0.083% IN NEBU
2.5000 mg | INHALATION_SOLUTION | Freq: Once | RESPIRATORY_TRACT | Status: AC
  Administered 2015-08-28: 2.5 mg via RESPIRATORY_TRACT
  Filled 2015-08-28: qty 3

## 2015-08-28 MED ORDER — ALBUTEROL SULFATE HFA 108 (90 BASE) MCG/ACT IN AERS: 1.0000 | INHALATION_SPRAY | Freq: Four times a day (QID) | RESPIRATORY_TRACT | Status: DC | PRN

## 2015-08-28 MED ORDER — PREDNISONE 50 MG PO TABS: ORAL_TABLET | ORAL | Status: DC

## 2015-08-28 MED ORDER — IPRATROPIUM-ALBUTEROL 0.5-2.5 (3) MG/3ML IN SOLN
3.0000 mL | Freq: Once | RESPIRATORY_TRACT | Status: AC
  Administered 2015-08-28: 3 mL via RESPIRATORY_TRACT
  Filled 2015-08-28: qty 3

## 2015-08-28 NOTE — ED Provider Notes (Signed)
CSN: [id number]     Arrival date & time 08/28/15  [id number] History   By signing my name below, I, Randa Evens, attest that this documentation has been prepared under the direction and in the presence of [employer], PA-C. Electronically Signed: Randa Evens, ED Scribe. 08/28/2015. 11:31 AM.    Chief Complaint  Patient presents with  . Cough   Patient is a 60 y.o. male presenting with cough. The history is provided by the patient. No language interpreter was used.  Cough Associated symptoms: wheezing   Associated symptoms: no chest pain, no fever and no sore throat    HPI Comments: Alexander Avila is a 60 y.o. male who presents to the Emergency Department complaining of intermittent productive cough onset 5 days prior. Pt reports associated wheezing. Pt does report some anterior chest wall soreness triggered by coughing. Pt does report possible sick contacts, he states he was here in the ED as a visitor 6 days ago and believes he may have caught an infection. Pt states that he was evaluated at urgent care 4 days ago and was diagnosed with bronchitis. Pt was prescribed Levaquin with no relief. Denies fever, congestion, sore throat, nausea, vomiting, abdominal pain, or CP.     Past Medical History  Diagnosis Date  . Hypertension   . Heart attack [facility])    Past Surgical History  Procedure Laterality Date  . Stents     Family History  Problem Relation Age of Onset  . Other Father    Social History  Substance Use Topics  . Smoking status: Never Smoker   . Smokeless tobacco: None  . Alcohol Use: Yes     Comment: occ    Review of Systems  Constitutional: Negative for fever.  HENT: Negative for congestion and sore throat.   Respiratory: Positive for cough and wheezing.   Cardiovascular: Negative for chest pain.  Gastrointestinal: Negative for nausea, vomiting and abdominal pain.  All other systems reviewed and are negative.    Allergies  Review of patient's allergies  indicates no known allergies.  Home Medications   Prior to Admission medications   Medication Sig Start Date End Date Taking? Authorizing Provider  aspirin 325 MG tablet Take 325 mg by mouth daily.     Yes Historical Provider, MD  atorvastatin (LIPITOR) 20 MG tablet Take 1 tablet by mouth daily. 08/26/13  Yes Historical Provider, MD  escitalopram (LEXAPRO) 20 MG tablet Take 1 tablet by mouth daily. 08/26/13  Yes Historical Provider, MD  ibuprofen (ADVIL,MOTRIN) 200 MG tablet Take 400 mg by mouth every 6 (six) hours as needed for moderate pain.    Yes Historical Provider, MD  levofloxacin (LEVAQUIN) 500 MG tablet Take 1 tablet by mouth daily. Starting 08/24/2015 x 10 days. 08/24/15  Yes Historical Provider, MD  losartan-hydrochlorothiazide (HYZAAR) 50-12.5 MG per tablet Take 1 tablet by mouth daily.     Yes Historical Provider, MD  metFORMIN (GLUCOPHAGE) 1000 MG tablet Take 1 tablet by mouth 2 (two) times daily with a meal.  07/22/13  Yes Historical Provider, MD  Multiple Vitamin (ONE-A-DAY MENS PO) Take 1 tablet by mouth daily.   Yes Historical Provider, MD  nitroGLYCERIN (NITROSTAT) 0.4 MG SL tablet Place 0.4 mg under the tongue every 5 (five) minutes as needed for chest pain.   Yes Historical Provider, MD  Vitamin D, Ergocalciferol, (DRISDOL) 50000 UNITS CAPS capsule Take 1 capsule by mouth once a week. Takes on Saturdays. 09/07/13  Yes Historical Provider, MD  zolpidem (  AMBIEN) 10 MG tablet Take 10 mg by mouth at bedtime as needed for sleep.   Yes Historical Provider, MD  albuterol (PROVENTIL HFA;VENTOLIN HFA) 108 (90 Base) MCG/ACT inhaler Inhale 1-2 puffs into the lungs every 6 (six) hours as needed for wheezing or shortness of breath. 08/28/15   Evalee Jefferson, PA-C  predniSONE (DELTASONE) 50 MG tablet 1 tablet daily for 4 days 08/29/15   Evalee Jefferson, PA-C  promethazine-codeine (PHENERGAN WITH CODEINE) 6.25-10 MG/5ML syrup Take 5 mLs by mouth every 4 (four) hours as needed for cough. 08/28/15   Evalee Jefferson, PA-C    BP 138/96 mmHg  Pulse 98  Temp(Src) 98.1 F (36.7 C) (Oral)  Resp 16  Ht 6' (1.829 m)  Wt 120.203 kg  BMI 35.93 kg/m2  SpO2 98%   Physical Exam  Constitutional: He is oriented to person, place, and time. He appears well-developed and well-nourished. No distress.  HENT:  Head: Normocephalic and atraumatic.  Eyes: Conjunctivae and EOM are normal.  Neck: Neck supple. No tracheal deviation present.  Cardiovascular: Normal rate.   Pulmonary/Chest: Effort normal. No respiratory distress. He has wheezes.  Expiratory wheeze throughout all lung fields with prolonged expirations.   Musculoskeletal: Normal range of motion. He exhibits no edema.  Neurological: He is alert and oriented to person, place, and time.  Skin: Skin is warm and dry.  Psychiatric: He has a normal mood and affect. His behavior is normal.  Nursing note and vitals reviewed.   ED Course  Procedures (including critical care time) DIAGNOSTIC STUDIES: Oxygen Saturation is 97% on RA, normal by my interpretation.    COORDINATION OF CARE: 11:27 AM-Discussed treatment plan with pt at bedside and pt agreed to plan.    Labs Review Labs Reviewed - No data to display  Imaging Review Dg Chest 2 View  08/28/2015  CLINICAL DATA:  Cough. EXAM: CHEST  2 VIEW COMPARISON:  01/28/2014 FINDINGS: The heart size and mediastinal contours are within normal limits. Decreased lung volumes. Both lungs are clear. The visualized skeletal structures are unremarkable. IMPRESSION: 1. Low lung volumes. 2. No acute findings noted. Electronically Signed   By: Kerby Moors M.D.   On: 08/28/2015 10:38   I have personally reviewed and evaluated these images as part of my medical decision-making.    MDM   Final diagnoses:  Bronchitis with bronchospasm    Medications  ipratropium-albuterol (DUONEB) 0.5-2.5 (3) MG/3ML nebulizer solution 3 mL (3 mLs Nebulization Given 08/28/15 1154)  albuterol (PROVENTIL) (2.5 MG/3ML) 0.083% nebulizer solution  2.5 mg (2.5 mg Nebulization Given 08/28/15 1158)  predniSONE (DELTASONE) tablet 60 mg (60 mg Oral Given 08/28/15 1223)   Pt given albuterol/atrovent neb with improvement in aeration, still with expiratory wheeze, but much improved after tx.  Prednisone 60 mg given.  Pt felt improved at time of dc.  He was placed on a prednisone pulse dose, albuterol mdi prescribed and hycodan prescription given.  Advised to continue his levaquin, f/u with pcp if sx persist or worsen.  I personally performed the services described in this documentation, which was scribed in my presence. The recorded information has been reviewed and is accurate.  After pt left dept, he returned stating was told by pharmacy that hycodan is on national backorder.  He was given prescription for phenergan/codeine.  Original hycodan prescription destroyed.  The patient appears reasonably screened and/or stabilized for discharge and I doubt any other medical condition or other [facility] requiring further screening, evaluation, or treatment in the ED at this  time prior to discharge.   I personally performed the services described in this documentation, which was scribed in my presence. The recorded information has been reviewed and is accurate.    Evalee Jefferson, PA-C 08/29/15 2103  Fredia Sorrow, MD 08/31/15 [phone]

## 2015-08-28 NOTE — ED Notes (Signed)
Pt ambulated around nursing station times one, SPO2 98% RA prior and remained throughout ambulation. Pt asymp, states breathing "feels better" as compared to PTA

## 2015-08-28 NOTE — Discharge Instructions (Signed)
How to Use an Inhaler °Proper inhaler technique is very important. Good technique ensures that the medicine reaches the lungs. Poor technique results in depositing the medicine on the tongue and back of the throat rather than in the airways. If you do not use the inhaler with good technique, the medicine will not help you. °STEPS TO FOLLOW IF USING AN INHALER WITHOUT AN EXTENSION TUBE °1. Remove the cap from the inhaler. °2. If you are using the inhaler for the first time, you will need to prime it. Shake the inhaler for 5 seconds and release four puffs into the air, away from your face. Ask your health care provider or pharmacist if you have questions about priming your inhaler. °3. Shake the inhaler for 5 seconds before each breath in (inhalation). °4. Position the inhaler so that the top of the canister faces up. °5. Put your index finger on the top of the medicine canister. Your thumb supports the bottom of the inhaler. °6. Open your mouth. °7. Either place the inhaler between your teeth and place your lips tightly around the mouthpiece, or hold the inhaler 1-2 inches away from your open mouth. If you are unsure of which technique to use, ask your health care provider. °8. Breathe out (exhale) normally and as completely as possible. °9. Press the canister down with your index finger to release the medicine. °10. At the same time as the canister is pressed, inhale deeply and slowly until your lungs are completely filled. This should take 4-6 seconds. Keep your tongue down. °11. Hold the medicine in your lungs for 5-10 seconds (10 seconds is best). This helps the medicine get into the small airways of your lungs. °12. Breathe out slowly, through pursed lips. Whistling is an example of pursed lips. °13. Wait at least 15-30 seconds between puffs. Continue with the above steps until you have taken the number of puffs your health care provider has ordered. Do not use the inhaler more than your health care provider  tells you. °14. Replace the cap on the inhaler. °15. Follow the directions from your health care provider or the inhaler insert for cleaning the inhaler. °STEPS TO FOLLOW IF USING AN INHALER WITH AN EXTENSION (SPACER) °1. Remove the cap from the inhaler. °2. If you are using the inhaler for the first time, you will need to prime it. Shake the inhaler for 5 seconds and release four puffs into the air, away from your face. Ask your health care provider or pharmacist if you have questions about priming your inhaler. °3. Shake the inhaler for 5 seconds before each breath in (inhalation). °4. Place the open end of the spacer onto the mouthpiece of the inhaler. °5. Position the inhaler so that the top of the canister faces up and the spacer mouthpiece faces you. °6. Put your index finger on the top of the medicine canister. Your thumb supports the bottom of the inhaler and the spacer. °7. Breathe out (exhale) normally and as completely as possible. °8. Immediately after exhaling, place the spacer between your teeth and into your mouth. Close your lips tightly around the spacer. °9. Press the canister down with your index finger to release the medicine. °10. At the same time as the canister is pressed, inhale deeply and slowly until your lungs are completely filled. This should take 4-6 seconds. Keep your tongue down and out of the way. °11. Hold the medicine in your lungs for 5-10 seconds (10 seconds is best). This helps the   medicine get into the small airways of your lungs. Exhale. °12. Repeat inhaling deeply through the spacer mouthpiece. Again hold that breath for up to 10 seconds (10 seconds is best). Exhale slowly. If it is difficult to take this second deep breath through the spacer, breathe normally several times through the spacer. Remove the spacer from your mouth. °13. Wait at least 15-30 seconds between puffs. Continue with the above steps until you have taken the number of puffs your health care provider has  ordered. Do not use the inhaler more than your health care provider tells you. °14. Remove the spacer from the inhaler, and place the cap on the inhaler. °15. Follow the directions from your health care provider or the inhaler insert for cleaning the inhaler and spacer. °If you are using different kinds of inhalers, use your quick relief medicine to open the airways 10-15 minutes before using a steroid if instructed to do so by your health care provider. If you are unsure which inhalers to use and the order of using them, ask your health care provider, nurse, or respiratory therapist. °If you are using a steroid inhaler, always rinse your mouth with water after your last puff, then gargle and spit out the water. Do not swallow the water. °AVOID: °· Inhaling before or after starting the spray of medicine. It takes practice to coordinate your breathing with triggering the spray. °· Inhaling through the nose (rather than the mouth) when triggering the spray. °HOW TO DETERMINE IF YOUR INHALER IS FULL OR NEARLY EMPTY °You cannot know when an inhaler is empty by shaking it. A few inhalers are now being made with dose counters. Ask your health care provider for a prescription that has a dose counter if you feel you need that extra help. If your inhaler does not have a counter, ask your health care provider to help you determine the date you need to refill your inhaler. Write the refill date on a calendar or your inhaler canister. Refill your inhaler 7-10 days before it runs out. Be sure to keep an adequate supply of medicine. This includes making sure it is not expired, and that you have a spare inhaler.  °SEEK MEDICAL CARE IF:  °· Your symptoms are only partially relieved with your inhaler. °· You are having trouble using your inhaler. °· You have some increase in phlegm. °SEEK IMMEDIATE MEDICAL CARE IF:  °· You feel little or no relief with your inhalers. You are still wheezing and are feeling shortness of breath or  tightness in your chest or both. °· You have dizziness, headaches, or a fast heart rate. °· You have chills, fever, or night sweats. °· You have a noticeable increase in phlegm production, or there is blood in the phlegm. °MAKE SURE YOU:  °· Understand these instructions. °· Will watch your condition. °· Will get help right away if you are not doing well or get worse. °  °This information is not intended to replace advice given to you by your health care provider. Make sure you discuss any questions you have with your health care provider. °  °Document Released: 05/04/2000 Document Revised: 02/25/2013 Document Reviewed: 12/04/2012 °Elsevier Interactive Patient Education ©2016 [employer] ° °

## 2015-08-28 NOTE — ED Notes (Signed)
Pt states he was a visitor in the ER on Monday and Tuesday developed respiratory symptoms with productive cough.  Was seen at urgent care on Wed and started on Levaquin, but is not feeling much better.

## 2016-04-02 DIAGNOSIS — Z6835 Body mass index (BMI) 35.0-35.9, adult: Secondary | ICD-10-CM | POA: Diagnosis not present

## 2016-04-02 DIAGNOSIS — E1165 Type 2 diabetes mellitus with hyperglycemia: Secondary | ICD-10-CM | POA: Diagnosis not present

## 2016-04-02 DIAGNOSIS — E782 Mixed hyperlipidemia: Secondary | ICD-10-CM | POA: Diagnosis not present

## 2016-04-02 DIAGNOSIS — Z1389 Encounter for screening for other disorder: Secondary | ICD-10-CM | POA: Diagnosis not present

## 2016-04-02 DIAGNOSIS — E6609 Other obesity due to excess calories: Secondary | ICD-10-CM | POA: Diagnosis not present

## 2016-04-02 DIAGNOSIS — G4733 Obstructive sleep apnea (adult) (pediatric): Secondary | ICD-10-CM | POA: Diagnosis not present

## 2016-04-02 DIAGNOSIS — E119 Type 2 diabetes mellitus without complications: Secondary | ICD-10-CM | POA: Diagnosis not present

## 2016-04-02 LAB — HEMOGLOBIN A1C: Hemoglobin A1C: 10.9

## 2016-07-20 DIAGNOSIS — E1165 Type 2 diabetes mellitus with hyperglycemia: Secondary | ICD-10-CM | POA: Diagnosis not present

## 2016-07-20 DIAGNOSIS — I2510 Atherosclerotic heart disease of native coronary artery without angina pectoris: Secondary | ICD-10-CM | POA: Diagnosis not present

## 2016-07-20 DIAGNOSIS — E6609 Other obesity due to excess calories: Secondary | ICD-10-CM | POA: Diagnosis not present

## 2016-07-20 DIAGNOSIS — E782 Mixed hyperlipidemia: Secondary | ICD-10-CM | POA: Diagnosis not present

## 2016-07-20 DIAGNOSIS — Z1389 Encounter for screening for other disorder: Secondary | ICD-10-CM | POA: Diagnosis not present

## 2016-07-20 DIAGNOSIS — Z6834 Body mass index (BMI) 34.0-34.9, adult: Secondary | ICD-10-CM | POA: Diagnosis not present

## 2016-07-20 LAB — HEMOGLOBIN A1C: HEMOGLOBIN A1C: 10.3

## 2016-08-09 DIAGNOSIS — G4733 Obstructive sleep apnea (adult) (pediatric): Secondary | ICD-10-CM | POA: Diagnosis not present

## 2016-10-29 DIAGNOSIS — E1165 Type 2 diabetes mellitus with hyperglycemia: Secondary | ICD-10-CM | POA: Diagnosis not present

## 2016-10-29 DIAGNOSIS — Z1389 Encounter for screening for other disorder: Secondary | ICD-10-CM | POA: Diagnosis not present

## 2016-10-29 DIAGNOSIS — Z6833 Body mass index (BMI) 33.0-33.9, adult: Secondary | ICD-10-CM | POA: Diagnosis not present

## 2016-10-29 DIAGNOSIS — I10 Essential (primary) hypertension: Secondary | ICD-10-CM | POA: Diagnosis not present

## 2016-10-29 LAB — HEMOGLOBIN A1C: HEMOGLOBIN A1C: 10.1

## 2016-12-05 DIAGNOSIS — Z1389 Encounter for screening for other disorder: Secondary | ICD-10-CM | POA: Diagnosis not present

## 2016-12-05 DIAGNOSIS — Z6833 Body mass index (BMI) 33.0-33.9, adult: Secondary | ICD-10-CM | POA: Diagnosis not present

## 2016-12-05 DIAGNOSIS — E119 Type 2 diabetes mellitus without complications: Secondary | ICD-10-CM | POA: Diagnosis not present

## 2016-12-05 DIAGNOSIS — M7061 Trochanteric bursitis, right hip: Secondary | ICD-10-CM | POA: Diagnosis not present

## 2016-12-05 DIAGNOSIS — E669 Obesity, unspecified: Secondary | ICD-10-CM | POA: Diagnosis not present

## 2016-12-12 ENCOUNTER — Ambulatory Visit (INDEPENDENT_AMBULATORY_CARE_PROVIDER_SITE_OTHER): Payer: Self-pay | Admitting: "Endocrinology

## 2016-12-12 ENCOUNTER — Encounter: Payer: Self-pay | Admitting: "Endocrinology

## 2016-12-12 VITALS — BP 123/85 | HR 78 | Ht 72.0 in | Wt 244.0 lb

## 2016-12-12 DIAGNOSIS — E6609 Other obesity due to excess calories: Secondary | ICD-10-CM | POA: Diagnosis not present

## 2016-12-12 DIAGNOSIS — E1159 Type 2 diabetes mellitus with other circulatory complications: Secondary | ICD-10-CM

## 2016-12-12 DIAGNOSIS — Z6833 Body mass index (BMI) 33.0-33.9, adult: Secondary | ICD-10-CM

## 2016-12-12 DIAGNOSIS — I10 Essential (primary) hypertension: Secondary | ICD-10-CM

## 2016-12-12 DIAGNOSIS — E782 Mixed hyperlipidemia: Secondary | ICD-10-CM

## 2016-12-12 MED ORDER — GLUCOSE BLOOD VI STRP: ORAL_STRIP | 2 refills | Status: AC

## 2016-12-12 NOTE — Progress Notes (Signed)
Subjective:    Patient ID: Alexander Avila, male    DOB: 04/06/1956. Patient is being seen in consultation for management of diabetes requested by  Sharilyn Sites, MD  Past Medical History:  Diagnosis Date  . Diabetes mellitus, type II ([place])   . Heart attack ([place])   . Hypercholesteremia   . Hypertension    Past Surgical History:  Procedure Laterality Date  . stents     Social History   Social History  . Marital status: Married    Spouse name: N/A  . Number of children: N/A  . Years of education: N/A   Social History Main Topics  . Smoking status: Never Smoker  . Smokeless tobacco: Never Used  . Alcohol use Yes     Comment: occ  . Drug use: No  . Sexual activity: Yes   Other Topics Concern  . None   Social History Narrative  . None   Outpatient Encounter Prescriptions as of 12/12/2016  Medication Sig  . aspirin 325 MG tablet Take 325 mg by mouth daily.    Marland Kitchen atorvastatin (LIPITOR) 20 MG tablet Take 1 tablet by mouth daily.  Marland Kitchen escitalopram (LEXAPRO) 20 MG tablet Take 1 tablet by mouth daily.  Marland Kitchen ibuprofen (ADVIL,MOTRIN) 200 MG tablet Take 400 mg by mouth every 6 (six) hours as needed for moderate pain.   Marland Kitchen losartan-hydrochlorothiazide (HYZAAR) 50-12.5 MG per tablet Take 1 tablet by mouth daily.    . metFORMIN (GLUCOPHAGE) 1000 MG tablet Take 1 tablet by mouth 2 (two) times daily with a meal.   . Multiple Vitamin (ONE-A-DAY MENS PO) Take 1 tablet by mouth daily.  . Vitamin D, Ergocalciferol, (DRISDOL) 50000 UNITS CAPS capsule Take 1 capsule by mouth once a week. Takes on Saturdays.  Marland Kitchen zolpidem (AMBIEN) 10 MG tablet Take 10 mg by mouth at bedtime as needed for sleep.  Marland Kitchen albuterol (PROVENTIL HFA;VENTOLIN HFA) 108 (90 Base) MCG/ACT inhaler Inhale 1-2 puffs into the lungs every 6 (six) hours as needed for wheezing or shortness of breath. (Patient not taking: Reported on 12/12/2016)  . glucose blood test strip Use as instructed  . nitroGLYCERIN (NITROSTAT) 0.4 MG SL tablet  Place 0.4 mg under the tongue every 5 (five) minutes as needed for chest pain.  . promethazine-codeine (PHENERGAN WITH CODEINE) 6.25-10 MG/5ML syrup Take 5 mLs by mouth every 4 (four) hours as needed for cough. (Patient not taking: Reported on 12/12/2016)  . [DISCONTINUED] levofloxacin (LEVAQUIN) 500 MG tablet Take 1 tablet by mouth daily. Starting 08/24/2015 x 10 days.  . [DISCONTINUED] predniSONE (DELTASONE) 50 MG tablet 1 tablet daily for 4 days (Patient not taking: Reported on 12/12/2016)   No facility-administered encounter medications on file as of 12/12/2016.    ALLERGIES: No Known Allergies VACCINATION STATUS:  There is no immunization history on file for this patient.  Diabetes  He presents for his initial diabetic visit. He has type 2 diabetes mellitus. Onset time: He was diagnosed at approximate age of 61 years. His disease course has been worsening (His most recent A1c was a 10.1 on 10/29/2016. Prior to that he has A1c is about 10 %on 2 occasions.). There are no hypoglycemic associated symptoms. Pertinent negatives for hypoglycemia include no confusion, headaches, pallor or seizures. Associated symptoms include blurred vision, polydipsia and polyuria. Pertinent negatives for diabetes include no chest pain, no fatigue, no polyphagia and no weakness. There are no hypoglycemic complications. Symptoms are worsening. Diabetic complications include heart disease. (Coronary artery disease (  MI)  status post stent placement in December 2010.) Risk factors for coronary artery disease include diabetes mellitus, dyslipidemia, hypertension, male sex and sedentary lifestyle. Current diabetic treatment includes oral agent (monotherapy). His weight is decreasing steadily (She lost approximately 25 pounds since September 2016.). He is following a generally unhealthy diet. When asked about meal planning, he reported none. He has not had a previous visit with a dietitian. He rarely participates in exercise. (He  did not bring any meter nor logs to review today. He states that he does not have a strips for his meter.) An ACE inhibitor/angiotensin II receptor blocker is being taken. He does not see a podiatrist.Eye exam is current.  Hyperlipidemia  This is a chronic problem. The current episode started more than 1 year ago. Exacerbating diseases include diabetes and obesity. Pertinent negatives include no chest pain, myalgias or shortness of breath. Current antihyperlipidemic treatment includes statins. Risk factors for coronary artery disease include diabetes mellitus, dyslipidemia, hypertension, male sex, obesity and a sedentary lifestyle.  Hypertension  This is a chronic problem. The current episode started more than 1 year ago. The problem is controlled. Associated symptoms include blurred vision. Pertinent negatives include no chest pain, headaches, neck pain, palpitations or shortness of breath. Risk factors for coronary artery disease include diabetes mellitus, male gender, obesity and sedentary lifestyle. Past treatments include angiotensin blockers.       Review of Systems  Constitutional: Negative for chills, fatigue, fever and unexpected weight change.  HENT: Negative for dental problem, mouth sores and trouble swallowing.   Eyes: Positive for blurred vision. Negative for visual disturbance.  Respiratory: Negative for cough, choking, chest tightness, shortness of breath and wheezing.   Cardiovascular: Negative for chest pain, palpitations and leg swelling.  Gastrointestinal: Negative for abdominal distention, abdominal pain, constipation, diarrhea, nausea and vomiting.  Endocrine: Positive for polydipsia and polyuria. Negative for polyphagia.  Genitourinary: Negative for dysuria, flank pain, hematuria and urgency.  Musculoskeletal: Negative for back pain, gait problem, myalgias and neck pain.  Skin: Negative for pallor, rash and wound.  Neurological: Negative for seizures, syncope, weakness,  numbness and headaches.  Psychiatric/Behavioral: Negative.  Negative for confusion and dysphoric mood.    Objective:    BP 123/85   Pulse 78   Ht 6' (1.829 m)   Wt 244 lb (110.7 kg)   SpO2 96%   BMI 33.09 kg/m   Wt Readings from Last 3 Encounters:  12/12/16 244 lb (110.7 kg)  08/28/15 265 lb (120.2 kg)  02/14/15 270 lb (122.5 kg)    Physical Exam  Constitutional: He is oriented to person, place, and time. He appears well-developed and well-nourished. He is cooperative. No distress.  HENT:  Head: Normocephalic and atraumatic.  Eyes: EOM are normal.  Neck: Normal range of motion. Neck supple. No tracheal deviation present. No thyromegaly present.  Cardiovascular: Normal rate, S1 normal, S2 normal and normal heart sounds.  Exam reveals no gallop.   No murmur heard. Pulses:      Dorsalis pedis pulses are 1+ on the right side, and 1+ on the left side.       Posterior tibial pulses are 1+ on the right side, and 1+ on the left side.  Pulmonary/Chest: Breath sounds normal. No respiratory distress. He has no wheezes.  Abdominal: Soft. Bowel sounds are normal. He exhibits no distension. There is no tenderness. There is no guarding and no CVA tenderness.  Musculoskeletal: He exhibits no edema.       Right shoulder:  He exhibits no swelling and no deformity.  Neurological: He is alert and oriented to person, place, and time. He has normal strength and normal reflexes. No cranial nerve deficit or sensory deficit. Gait normal.  Skin: Skin is warm and dry. No rash noted. No cyanosis. Nails show no clubbing.  Psychiatric: He has a normal mood and affect. His speech is normal and behavior is normal. Judgment and thought content normal. Cognition and memory are normal.     CMP ( most recent) CMP     Component Value Date/Time   NA 139 01/14/2011 2314   K 3.8 01/14/2011 2314   CL 104 01/14/2011 2314   CO2 27 01/14/2011 2314   GLUCOSE 156 (H) 01/14/2011 2314   BUN 21 01/14/2011 2314    CREATININE 0.99 01/14/2011 2314   CALCIUM 9.0 01/14/2011 2314   PROT 6.0 05/13/2008 1600   ALBUMIN 3.8 05/13/2008 1600   AST 26 05/13/2008 1600   ALT 32 05/13/2008 1600   ALKPHOS 44 05/13/2008 1600   BILITOT 0.6 05/13/2008 1600   GFRNONAA >60 01/14/2011 2314   GFRAA >60 01/14/2011 2314     Diabetic Labs (most recent): Lab Results  Component Value Date   HGBA1C 10.1 10/29/2016   HGBA1C 10.3 07/20/2016   HGBA1C 10.9 04/02/2016     Lipid Panel ( most recent) Lipid Panel     Component Value Date/Time   CHOL  05/13/2008 1600    120        ATP III CLASSIFICATION:  <200     mg/dL   Desirable  200-239  mg/dL   Borderline High  >=240    mg/dL   High   TRIG 87 05/13/2008 1600   HDL 32 (L) 05/13/2008 1600   CHOLHDL 3.8 05/13/2008 1600   VLDL 17 05/13/2008 1600   LDLCALC  05/13/2008 1600    71        Total Cholesterol/HDL:CHD Risk Coronary Heart Disease Risk Table                     Men   Women  1/2 Average Risk   3.4   3.3       Assessment & Plan:   1. DM type 2 causing vascular disease ([place])  - Patient has currently uncontrolled symptomatic type 2 DM since  61 years of age. - He came with no meter nor logs, but his most recent A1c was 10.1% on 10/29/2016. - No recent labs to review, I will send him for new set of labs today.   His diabetes is complicated by coronary artery disease (MI) status post stent placement in 2010, obesity/sedentary life, heavy alcohol use since early 2s of age and  patient remains at a high risk for more acute and chronic complications which include CAD, CVA, CKD, retinopathy, and neuropathy. These are all discussed in detail with the patient.  - I have counseled the patient on diet management and weight loss, by adopting a carbohydrate restricted/protein rich diet.  - Suggestion is made for patient to avoid simple carbohydrates   from his diet including Cakes , Desserts, Ice Cream,  Soda (  diet and regular) , Sweet Tea , Candies,  Chips,  Cookies, Artificial Sweeteners,   and "Sugar-free" Products . This will help patient to have stable blood glucose profile and potentially avoid unintended weight gain.  - I encouraged the patient to switch to  unprocessed or minimally processed complex starch and increased protein intake (animal or  plant source), fruits, and vegetables.  - Patient is advised to stick to a routine mealtimes to eat 3 meals  a day and avoid unnecessary snacks ( to snack only to correct hypoglycemia).  - The patient will be scheduled with Jearld Fenton, RDN, CDE for individualized DM education.  - I have approached patient with the following individualized plan to manage diabetes and patient agrees:   -  Given his current glycemic burden with A1c above 10% for the last year, he will need at least basal insulin therapy.  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 - However, it is essential to assure his commitment for proper monitoring for safe use of insulin.  I  will proceed to initiate strict monitoring of glucose 4 times a day-before meals and at bedtime and return in 2 weeks for reevaluation.  -Patient is encouraged to call clinic for blood glucose levels less than 70 or above 300 mg /dl. - I will continue metformin 1000 mg by mouth twice a day, therapeutically suitable for patient . - He may benefit from low-dose insert inhibitors if he has normal renal function.  - He is not a suitable candidate for  incretin therapy due to history of heavy alcohol exposure and current active drinking.  - Patient specific target  A1c;  LDL, HDL, Triglycerides, and  Waist Circumference were discussed in detail.  2) BP/HTN: Controlled. Continue current medications including ACEI/ARB. 3) Lipids/HPL:   His current lipid panel unknown,   Patient is advised to continue statins. 4)  Weight/Diet: CDE Consult will be initiated , exercise, and detailed carbohydrates information  provided.  5) Chronic Care/Health Maintenance:  -Patient is on ACEI/ARB and Statin medications and encouraged to continue to follow up with Ophthalmology, Podiatrist at least yearly or according to recommendations, and advised to  stay away from smoking. I have recommended yearly flu vaccine and pneumonia vaccination at least every 5 years; moderate intensity exercise for up to 150 minutes weekly; and  sleep for at least 7 hours a day.  - 60 minutes of time was spent on the care of this patient , 50% of which was applied for counseling on diabetes complications and their preventions.  - Patient to bring meter and  blood glucose logs during his next visit.  - I advised patient to maintain close follow up with Sharilyn Sites, MD for primary care needs.  Follow up plan: - Return in about 2 weeks (around 12/26/2016) for meter, and logs, labs today.  Glade Lloyd, MD Phone: [phone]  Fax: [phone]   12/12/2016, 4:02 PM

## 2016-12-12 NOTE — Patient Instructions (Signed)

## 2016-12-31 ENCOUNTER — Encounter: Payer: Self-pay | Admitting: "Endocrinology

## 2016-12-31 ENCOUNTER — Ambulatory Visit: Payer: BLUE CROSS/BLUE SHIELD | Admitting: "Endocrinology

## 2017-02-20 DIAGNOSIS — M25551 Pain in right hip: Secondary | ICD-10-CM | POA: Diagnosis not present

## 2017-02-20 DIAGNOSIS — Z6832 Body mass index (BMI) 32.0-32.9, adult: Secondary | ICD-10-CM | POA: Diagnosis not present

## 2017-02-20 DIAGNOSIS — M25561 Pain in right knee: Secondary | ICD-10-CM | POA: Diagnosis not present

## 2017-02-20 DIAGNOSIS — E6609 Other obesity due to excess calories: Secondary | ICD-10-CM | POA: Diagnosis not present

## 2017-02-20 DIAGNOSIS — Z23 Encounter for immunization: Secondary | ICD-10-CM | POA: Diagnosis not present

## 2017-02-20 DIAGNOSIS — M7061 Trochanteric bursitis, right hip: Secondary | ICD-10-CM | POA: Diagnosis not present

## 2017-03-02 ENCOUNTER — Emergency Department (HOSPITAL_COMMUNITY)
Admission: EM | Admit: 2017-03-02 | Discharge: 2017-03-02 | Disposition: A | Discharge status: Home / Self Care | Payer: BLUE CROSS/BLUE SHIELD | Attending: Emergency Medicine | Admitting: Emergency Medicine

## 2017-03-02 ENCOUNTER — Encounter (HOSPITAL_COMMUNITY): Payer: Self-pay | Admitting: *Deleted

## 2017-03-02 ENCOUNTER — Emergency Department (HOSPITAL_COMMUNITY): Payer: BLUE CROSS/BLUE SHIELD

## 2017-03-02 DIAGNOSIS — I252 Old myocardial infarction: Secondary | ICD-10-CM | POA: Insufficient documentation

## 2017-03-02 DIAGNOSIS — Z7982 Long term (current) use of aspirin: Secondary | ICD-10-CM | POA: Insufficient documentation

## 2017-03-02 DIAGNOSIS — E119 Type 2 diabetes mellitus without complications: Secondary | ICD-10-CM | POA: Diagnosis not present

## 2017-03-02 DIAGNOSIS — Z7984 Long term (current) use of oral hypoglycemic drugs: Secondary | ICD-10-CM | POA: Insufficient documentation

## 2017-03-02 DIAGNOSIS — Z955 Presence of coronary angioplasty implant and graft: Secondary | ICD-10-CM | POA: Diagnosis not present

## 2017-03-02 DIAGNOSIS — I10 Essential (primary) hypertension: Secondary | ICD-10-CM | POA: Insufficient documentation

## 2017-03-02 DIAGNOSIS — Z79899 Other long term (current) drug therapy: Secondary | ICD-10-CM | POA: Diagnosis not present

## 2017-03-02 DIAGNOSIS — I2510 Atherosclerotic heart disease of native coronary artery without angina pectoris: Secondary | ICD-10-CM | POA: Diagnosis not present

## 2017-03-02 DIAGNOSIS — M25551 Pain in right hip: Secondary | ICD-10-CM

## 2017-03-02 MED ORDER — MELOXICAM 7.5 MG PO TABS: 7.5000 mg | ORAL_TABLET | Freq: Two times a day (BID) | ORAL | 0 refills | Status: AC

## 2017-03-02 NOTE — ED Triage Notes (Signed)
Pt comes in with right hip pain starting in July. He has been seen by his PCP and they have done a series of things attempting to get this pain to go away. Pt states his pain has been getting worse over the last few days, he has been lifting and carrying gas to his generator. Pt ambulatory.

## 2017-03-02 NOTE — ED Provider Notes (Signed)
[facility] DEPT Provider Note   CSN: [id number] Arrival date & time: 03/02/17  0709     History   Chief Complaint Chief Complaint  Patient presents with  . Hip Pain    Alexander Avila is a 61 y.o. male.  Alexander  The patient is a 61 year old male with a history of diabetes, prior myocardial infarction, hypertension and high cholesterol, has never had any issues with his hips however he states that approximately beginning of July he developed some right hip pain when he woke up one morning which has been present ever since, seems to have worsened over the last 10 days, saw his family doctor 10 days ago was given tramadol which gives him some relief but notes that every time he tries to walk it hurts, he can find positions at rest which do not hurt but when he internally or externally rotates his hip he has increased pain which shoots from his hip down towards his knee. There is no numbness or weakness, no fevers, no swelling, no rashes over this area. He is no longer taking anti-inflammatories because the tramadol works better. He has not had any x-rays, he has been referred to orthopedics but does not yet have a date nor has he seen them.  Past Medical History:  Diagnosis Date  . Diabetes mellitus, type II ([place])   . Heart attack ([place])   . Hypercholesteremia   . Hypertension     Patient Active Problem List   Diagnosis Date Noted  . DM type 2 causing vascular disease ([place]) 12/12/2016  . Class 1 obesity due to excess calories with serious comorbidity and body mass index (BMI) of 33.0 to 33.9 in adult 12/12/2016  . Mixed hyperlipidemia 09/28/2013  . Essential hypertension 09/28/2013  . CAD (coronary artery disease) 09/28/2013  . SCIATICA 10/04/2008  . BACK PAIN 10/04/2008    Past Surgical History:  Procedure Laterality Date  . stents         Home Medications    Prior to Admission medications   Medication Sig Start Date End Date Taking? Authorizing Provider    albuterol (PROVENTIL HFA;VENTOLIN HFA) 108 (90 Base) MCG/ACT inhaler Inhale 1-2 puffs into the lungs every 6 (six) hours as needed for wheezing or shortness of breath. Patient not taking: Reported on 12/12/2016 08/28/15   Evalee Jefferson, PA-C  aspirin 325 MG tablet Take 325 mg by mouth daily.      [provider]  atorvastatin (LIPITOR) 20 MG tablet Take 1 tablet by mouth daily. 08/26/13   [provider]  escitalopram (LEXAPRO) 20 MG tablet Take 1 tablet by mouth daily. 08/26/13   [provider]  glucose blood test strip Use as instructed 12/12/16   Cassandria Anger, MD  ibuprofen (ADVIL,MOTRIN) 200 MG tablet Take 400 mg by mouth every 6 (six) hours as needed for moderate pain.     [provider]  losartan-hydrochlorothiazide (HYZAAR) 50-12.5 MG per tablet Take 1 tablet by mouth daily.      [provider]  meloxicam (MOBIC) 7.5 MG tablet Take 1 tablet (7.5 mg total) by mouth 2 (two) times daily. 03/02/17 03/17/17  Noemi Chapel, MD  metFORMIN (GLUCOPHAGE) 1000 MG tablet Take 1 tablet by mouth 2 (two) times daily with a meal.  07/22/13   [provider]  Multiple Vitamin (ONE-A-DAY MENS PO) Take 1 tablet by mouth daily.    [provider]  nitroGLYCERIN (NITROSTAT) 0.4 MG SL tablet Place 0.4 mg under the tongue  every 5 (five) minutes as needed for chest pain.    [provider]  promethazine-codeine (PHENERGAN WITH CODEINE) 6.25-10 MG/5ML syrup Take 5 mLs by mouth every 4 (four) hours as needed for cough. Patient not taking: Reported on 12/12/2016 08/28/15   Evalee Jefferson, PA-C  Vitamin D, Ergocalciferol, (DRISDOL) 50000 UNITS CAPS capsule Take 1 capsule by mouth once a week. Takes on Saturdays. 09/07/13   [provider]  zolpidem (AMBIEN) 10 MG tablet Take 10 mg by mouth at bedtime as needed for sleep.    [provider]    Family History Family History  Problem Relation Age of Onset  . Other Father   . Diabetes  Brother     Social History Social History  Substance Use Topics  . Smoking status: Never Smoker  . Smokeless tobacco: Never Used  . Alcohol use Yes     Comment: occ     Allergies   Patient has no known allergies.   Review of Systems Review of Systems  All other systems reviewed and are negative.    Physical Exam Updated Vital Signs BP (!) 146/104 (BP Location: Right Arm)   Pulse 98   Temp 98.9 F (37.2 C) (Oral)   Resp 18   Ht 6' (1.829 m)   Wt 108.9 kg (240 lb)   SpO2 99%   BMI 32.55 kg/m   Physical Exam  Constitutional: He appears well-developed and well-nourished. No distress.  HENT:  Head: Normocephalic and atraumatic.  Mouth/Throat: Oropharynx is clear and moist. No oropharyngeal exudate.  Eyes: Pupils are equal, round, and reactive to light. Conjunctivae and EOM are normal. Right eye exhibits no discharge. Left eye exhibits no discharge. No scleral icterus.  Neck: Normal range of motion. Neck supple. No JVD present. No thyromegaly present.  Cardiovascular: Normal rate, regular rhythm, normal heart sounds and intact distal pulses.  Exam reveals no gallop and no friction rub.   No murmur heard. Pulmonary/Chest: Effort normal and breath sounds normal. No respiratory distress. He has no wheezes. He has no rales.  Abdominal: Soft. Bowel sounds are normal. He exhibits no distension and no mass. There is no tenderness.  Musculoskeletal: Normal range of motion. He exhibits tenderness ( Pain with range of motion of the right hip into internal or external rotation). He exhibits no edema.  No pain with flexion of the hip, no pain with extension, able to straight leg raise without pain  Lymphadenopathy:    He has no cervical adenopathy.  Neurological: He is alert. Coordination normal.  Normal strength and sensation to the right lower extremity  Skin: Skin is warm and dry. No rash noted. No erythema.  Psychiatric: He has a normal mood and affect. His behavior is normal.   Nursing note and vitals reviewed.    ED Treatments / Results  Labs (all labs ordered are listed, but only abnormal results are displayed) Labs Reviewed - No data to display  Radiology Dg Hip Unilat W Or Wo Pelvis 2-3 Views Right  Result Date: 03/02/2017 CLINICAL DATA:  Right hip pain since July EXAM: DG HIP (WITH OR WITHOUT PELVIS) 2-3V RIGHT COMPARISON:  02/14/2015 FINDINGS: No fracture or malalignment. No degenerative hip narrowing or spurring. Enthesophytes to the iliac spines and right greater trochanter. IMPRESSION: No acute finding or hip degeneration. Electronically Signed   By: Monte Fantasia M.D.   On: 03/02/2017 08:36    Procedures Procedures (including critical care time)  Medications Ordered in ED Medications - No data to  display   Initial Impression / Assessment and Plan / ED Course  I have reviewed the triage vital signs and the nursing notes.  Pertinent labs & imaging results that were available during my care of the patient were reviewed by me and considered in my medical decision making (see chart for details).     Ongoing pain in the hip, we'll provide imaging to rule out fractures tumors or other sources of pain including arthritis, patient informed he would need to follow-up closely with orthopedics.  No joint effusion - no swelling of the hip, no neuro sx Has xray unremarkable for bony problems Needs ortho f/u nsaids Rx given  Final Clinical Impressions(s) / ED Diagnoses   Final diagnoses:  Hip pain, right    New Prescriptions New Prescriptions   MELOXICAM (MOBIC) 7.5 MG TABLET    Take 1 tablet (7.5 mg total) by mouth 2 (two) times daily.     Noemi Chapel, MD 03/02/17 [phone]

## 2017-03-02 NOTE — Discharge Instructions (Signed)
Your xray shows no fractures and no arthritis of any concern Your pain may be related to ligaments or muscles that are deeper in the hip that are causing pain You should take the Mobic twice daily for anti inflammatory pain control - if you need more pain medicine - continue the tramadol  Call Dr. Aline Brochure Monday morning for follow up appointment

## 2017-03-05 ENCOUNTER — Ambulatory Visit (INDEPENDENT_AMBULATORY_CARE_PROVIDER_SITE_OTHER): Payer: BLUE CROSS/BLUE SHIELD

## 2017-03-05 ENCOUNTER — Encounter: Payer: Self-pay | Admitting: Orthopedic Surgery

## 2017-03-05 ENCOUNTER — Ambulatory Visit (INDEPENDENT_AMBULATORY_CARE_PROVIDER_SITE_OTHER): Payer: BLUE CROSS/BLUE SHIELD | Admitting: Orthopedic Surgery

## 2017-03-05 VITALS — BP 144/98 | HR 101 | Resp 16 | Ht 72.0 in | Wt 234.0 lb

## 2017-03-05 DIAGNOSIS — M5431 Sciatica, right side: Secondary | ICD-10-CM

## 2017-03-05 MED ORDER — GABAPENTIN 100 MG PO CAPS: 100.0000 mg | ORAL_CAPSULE | Freq: Three times a day (TID) | ORAL | 2 refills | Status: DC

## 2017-03-05 MED ORDER — TRAMADOL HCL 50 MG PO TABS: 50.0000 mg | ORAL_TABLET | Freq: Four times a day (QID) | ORAL | 0 refills | Status: DC | PRN

## 2017-03-05 NOTE — Progress Notes (Signed)
NEW PATIENT OFFICE VISIT    Chief Complaint  Patient presents with  . Hip Pain    right hip     61 year old male started having right hip pain in July presents for evaluation of ongoing right hip and leg pain  The patient started having right-sided hip buttock pain in July noticed that he was limpingdeveloped pain along the right leg radiating to the medial side of the lower leg. He was treated with tramadol Aleve and ibuprofen did not improve he went to the ER x-rays of his hip was normal he went to his primary care doctor and nothing was found in his hip so he was sent here for further evaluation  He is now complaining of posterior hip pain buttock pain radiates down the medial side of his leg no numbness or tingling. He says he does remember having any back issues but his chart says in 2010 he had back pain with sciatica  He is on tramadol Aleve and ibuprofen he did take some mobile it for couple of days.      Review of Systems  Gastrointestinal: Negative.   Genitourinary: Negative.   Neurological: Positive for sensory change. Negative for tingling.     Past Medical History:  Diagnosis Date  . Diabetes mellitus, type II ([place])   . Heart attack ([place])   . Hypercholesteremia   . Hypertension     Past Surgical History:  Procedure Laterality Date  . stents      Family History  Problem Relation Age of Onset  . Other Father   . Diabetes Brother    Social History  Substance Use Topics  . Smoking status: Never Smoker  . Smokeless tobacco: Never Used  . Alcohol use Yes     Comment: occ   No Known Allergies   Current Meds  Medication Sig  . albuterol (PROVENTIL HFA;VENTOLIN HFA) 108 (90 Base) MCG/ACT inhaler Inhale 1-2 puffs into the lungs every 6 (six) hours as needed for wheezing or shortness of breath.  Marland Kitchen aspirin 325 MG tablet Take 325 mg by mouth daily.    Marland Kitchen atorvastatin (LIPITOR) 20 MG tablet Take 1 tablet by mouth daily.  Marland Kitchen escitalopram (LEXAPRO) 20 MG  tablet Take 1 tablet by mouth daily.  Marland Kitchen glucose blood test strip Use as instructed  . ibuprofen (ADVIL,MOTRIN) 200 MG tablet Take 400 mg by mouth every 6 (six) hours as needed for moderate pain.   Marland Kitchen losartan-hydrochlorothiazide (HYZAAR) 50-12.5 MG per tablet Take 1 tablet by mouth daily.    . meloxicam (MOBIC) 7.5 MG tablet Take 1 tablet (7.5 mg total) by mouth 2 (two) times daily.  . metFORMIN (GLUCOPHAGE) 1000 MG tablet Take 1 tablet by mouth 2 (two) times daily with a meal.   . Multiple Vitamin (ONE-A-DAY MENS PO) Take 1 tablet by mouth daily.  . nitroGLYCERIN (NITROSTAT) 0.4 MG SL tablet Place 0.4 mg under the tongue every 5 (five) minutes as needed for chest pain.  . traMADol (ULTRAM) 50 MG tablet   . Vitamin D, Ergocalciferol, (DRISDOL) 50000 UNITS CAPS capsule Take 1 capsule by mouth once a week. Takes on Saturdays.  Marland Kitchen zolpidem (AMBIEN) 10 MG tablet Take 10 mg by mouth at bedtime as needed for sleep.    BP (!) 144/98   Pulse (!) 101   Resp 16   Ht 6' (1.829 m)   Wt 234 lb (106.1 kg)   BMI 31.74 kg/m   Physical Exam  Constitutional:  He's normally developed normal  grooming oriented 3 mood is pleasant affect is flat gait is somewhat slow and remarkable for limping favoring the right side  Musculoskeletal:  Right and left hip had equal range of motion both hips are stable adequate strength in both lower extremities normal skin bilaterally. He had no peripheral edema in either leg his groin lymph nodes were negative bilaterally had normal sensation to soft touch in each leg. Reflexes are 2+ and equal he had some pain on the right side with straight leg raise at 60-70 balance was normal  He had tenderness in his lower back  Vitals reviewed.   Ortho Exam  Meds ordered this encounter  Medications  . traMADol (ULTRAM) 50 MG tablet    Encounter Diagnosis  Name Primary?  . Back pain with right-sided sciatica Yes   I looked at his hip x-rays AP pelvis and lateral and that was  normal. 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

## 2017-03-26 ENCOUNTER — Encounter (HOSPITAL_COMMUNITY): Payer: Self-pay

## 2017-03-26 ENCOUNTER — Ambulatory Visit (HOSPITAL_COMMUNITY): Payer: BLUE CROSS/BLUE SHIELD | Attending: Orthopedic Surgery

## 2017-03-26 DIAGNOSIS — R6889 Other general symptoms and signs: Secondary | ICD-10-CM

## 2017-03-26 DIAGNOSIS — R2689 Other abnormalities of gait and mobility: Secondary | ICD-10-CM | POA: Insufficient documentation

## 2017-03-26 DIAGNOSIS — R531 Weakness: Secondary | ICD-10-CM | POA: Diagnosis not present

## 2017-03-26 DIAGNOSIS — G8929 Other chronic pain: Secondary | ICD-10-CM | POA: Insufficient documentation

## 2017-03-26 DIAGNOSIS — M5441 Lumbago with sciatica, right side: Secondary | ICD-10-CM | POA: Diagnosis not present

## 2017-03-26 NOTE — Patient Instructions (Signed)
  LOWER TRUNK ROTATIONS - LTR 10 seconds, 10 times Lying on your back with your knees bent, gently move your knees side-to-side.   One leg at a time   Single Knee to Chest with belt 10 seconds, 10 times While lying on your back, hold your knee with a belt or towel and gently pull it up towards your chest.     SEATED PIRIFORMIS STRETCH 10 seconds, 10 times While sitting in a chair, cross your leg with the ankle of one foot on the knee of the other.  Next, pull the top knee upward towards your opposite shoulder for a stretch.     SEATED HAMSTRING STRETCH 10 seconds, 10 times While seated, rest your heel on the floor with your knee straight and gently lean forward until a stretch is felt behind your knee/thigh.

## 2017-03-26 NOTE — Therapy (Signed)
[facility] [place], [place], [address] Phone: [phone]   Fax:  [phone]  Physical Therapy Evaluation  Patient Details  Name: Alexander Avila MRN: [id number] Date of Birth: [birth year] Referring Provider: Carole Civil MD   Encounter Date: 03/26/2017  PT End of Session - 03/26/17 1756    Visit Number  1    Number of Visits  9    Date for PT Re-Evaluation  04/23/17    Authorization Type  BCBS other    Authorization Time Period  03/26/2017-04/23/2017    PT Start Time  1645    PT Stop Time  1735    PT Time Calculation (min)  50 min    Activity Tolerance  Patient limited by pain    Behavior During Therapy  [facility] for tasks assessed/performed       Past Medical History:  Diagnosis Date  . Diabetes mellitus, type II ([place])   . Heart attack ([place])   . Hypercholesteremia   . Hypertension     Past Surgical History:  Procedure Laterality Date  . stents      There were no vitals filed for this visit.   Subjective Assessment - 03/26/17 1654    Subjective  Pt notes around July he woke up one morning and had pain in his Rt. hip. He notes it was aggravating for awhile but he just ignored it until a few months later it was worse. He noted standing and walking caused more pain in hip and started to radiate down right leg to Rt. foot. He notes going to MD and orthopedic for various medication and follow ups. Xray completed by Aline Brochure displaying L4/5 disc irritation on the local nerve. Follow up 04/25/17 to re-evaluation after therapy. Pt notes the only time he has relief is laying in bed flat on his back which he used to sleep on his side     Pertinent History  Heart attack 8 years ago, stents; diabetic recently diagnosed non controlled at moment     Limitations  Sitting;Standing;Walking;Lifting    How long can you sit comfortably?  Depends on chair; 15 minutes maximum     How long can you stand comfortably?  morning possibly  up to 10 minutes; evening very minimally     How long can you walk comfortably?  Approximately 8 steps in the morning     Diagnostic tests  xray    Patient Stated Goals  To improve pain and management     Currently in Pain?  Yes    Pain Score  5     Pain Location  Back    Pain Orientation  Right;Lower    Pain Descriptors / Indicators  Sharp;Constant;Aching    Pain Type  Chronic pain    Pain Onset  More than a month ago    Pain Frequency  Constant    Aggravating Factors   Walking, sitting, standing, lifting anything     Pain Relieving Factors  Laying flat on back at night     Effect of Pain on Daily Activities  Working in the yard/landscaping, doing things around the house          [facility] PT Assessment - 03/26/17 0001      Assessment   Medical Diagnosis  Low back pain Rt. sciatica    Referring Provider  Carole Civil MD    Onset Date/Surgical Date  12/24/16    Prior Therapy  no  Balance Screen   Has the patient fallen in the past 6 months  Yes    How many times?  3    Has the patient had a decrease in activity level because of a fear of falling?   Yes    Is the patient reluctant to leave their home because of a fear of falling?   Yes limits self to only go to work   limits self to only go to work     [place] residence    Living Arrangements  Spouse/significant other daughter   daughter   Type of [place] to enter    Entrance Stairs-Number of Steps  1    Entrance Stairs-Rails  Left;Right    Home Layout  Two level    Alternate Level Stairs-Number of Steps  15    Alternate Level Stairs-Rails  Right      Prior Function   Level of Independence  Independent      Functional Tests   Functional tests  Sit to Stand;Single leg stance      Single Leg Stance   Comments  max 1-2 seconds on Rt due to pain (hip); Lt 3 seconds       Sit to Stand   Comments  unable to assess significant pain bilateral UE  use with increased time      AROM   Lumbar Flexion  moderate limitation, RFIS no change    Lumbar Extension  significant limiation, REIS slightly worse    Lumbar - Right Side Bend  slight limitation, stretch Left    Lumbar - Left Side Bend  slight limitation, pull Rt.     Lumbar - Right Rotation  signifiacnt limitation    Lumbar - Left Rotation  significant limitation      Strength   Strength Assessment Site  Hip;Knee;Ankle    Right Hip Flexion  4-/5    Right Hip External Rotation   4-/5    Right Hip Internal Rotation  4-/5    Right Hip ABduction  -- unable to assume position to assess due to pain   unable to assume position to assess due to pain   Left Hip Flexion  4+/5    Left Hip External Rotation  4-/5    Left Hip Internal Rotation  4-/5    Right Knee Flexion  4/5    Right Knee Extension  4/5    Left Knee Flexion  4+/5    Left Knee Extension  4+/5    Right Ankle Dorsiflexion  5/5    Left Ankle Dorsiflexion  5/5      Flexibility   Hamstrings  Slight limitation bilaterally    Quadriceps  Sev sharp pain Rt. , unable to asses left    Piriformis  unable to asses Rt., Lt. moderate limiation      Palpation   SI assessment   Posterior sheer caused relief    Palpation comment  Increased tension with pain Rt. piriformis, inferior glute, lateral Rt leg from greater trochanter inferior laterally and anterior over quad      Special Tests   Hip Special Tests   Hip Scouring      FABER test   findings  Positive    Side  Right    Comment  FADDIR positive; long axis distraction positive (relief)      Straight Leg Raise   Findings  Negative  Comment  Bilaterally      Hip Scouring   Findings  Positive    Side  Right      Ambulation/Gait   Ambulation Distance (Feet)  20 Feet    Assistive device  None    Gait Pattern  Step-to pattern;Decreased stance time - right;Decreased step length - left             Objective measurements completed on examination: See above  findings.              PT Education - 03/26/17 1759    Education provided  Yes    Education Details  HEP, examination findings, contributing factors, lumbar roll for supported extension, sleeping in sidelying with pillows, sleeping supine with pillows under knee for support     Person(s) Educated  Patient    Methods  Explanation;Demonstration;Handout    Comprehension  Verbalized understanding       PT Short Term Goals - 03/26/17 1809      PT SHORT TERM GOAL #1   Title  Patient will be independent with HEP to improve overall pain management and functional strength to reduce symptoms.     Time  2    Period  Weeks    Status  New    Target Date  04/09/17      PT SHORT TERM GOAL #2   Title  Patient will report at most 3/10 pain throughout session to indicate improved tolerance to functional mobility.     Time  2    Period  Weeks    Status  New      PT SHORT TERM GOAL #3   Title  Patient will present with improved bed mechanics sit to supine and supine to sit without reliance on verbal cueing to improve protection of lumbar region.     Time  2    Period  Weeks    Status  New        PT Long Term Goals - 03/26/17 1811      PT LONG TERM GOAL #1   Title  Patient will have at least 1 grade increased with MMT testing to improve functional strength.     Time  4    Period  Weeks    Status  New    Target Date  04/23/17      PT LONG TERM GOAL #2   Title  Pt will report improved tolerance to sitting, standing and walking by at least 15 minutes to improve functional mobility at home and work.     Time  4    Period  Weeks    Status  New      PT LONG TERM GOAL #3   Title  Patient will present with improved lumbar and thoracic mobility without increase in symptoms to promote good lifting mechanics for work tolerance.     Time  4    Period  Weeks    Status  New             Plan - 03/26/17 1801    Clinical Impression Statement  Patient is a 61 year old male  presenting to [facility] with significant limitation functionally due to low back pain with sciatica and hip pain. Contributing factors include but are not limited to decreased hip mobility, hip strength, lumbar/thoracic mobility, and hamstring length and core stability/strength. Patient presents with tension in right glute/piriformis area, as well as, lateral hip around greater trochanter down lateral thigh into anterior  with significant increase in pain. Patient noted relief with long axis distraction of Rt. Leg and compression through posterior sheer SI testing indicating possible hip impingement contributing to pain supported by positive Corky Sox and F[place] testing. No increase in pain with SLR bilaterally testing. Overall, patient will benefit from skilled PT to address the pain and limited mobility initially to provide improved quality of daily living at home and work. Patient will benefit from skilled PT to address strengthening, stabilization and balance when appropriate to promote return to PLOF and activity in community, whereas, patient noted he only leaves home to go to work which he utilizes a wheelchair to make it through the day.     History and Personal Factors relevant to plan of care:  Heart attack 8 years ago, unstable diabetes with recent dx     Clinical Presentation  Evolving    Clinical Decision Making  High    Rehab Potential  Good    PT Frequency  2x / week    PT Duration  4 weeks    PT Treatment/Interventions  ADLs/Self Care Home Management;Cryotherapy;Electrical Stimulation;Gait training;Traction;Balance training;Neuromuscular re-education;Patient/family education;Functional mobility training;Therapeutic activities;Therapeutic exercise;Manual techniques;Passive range of motion;Dry needling;Energy conservation;Taping    PT Next Visit Plan  Review evaluation, goals and HEP; Lumbar excursion standing, throacic excursion, wall slides thoracic mobility; manual to piriformis and lateral thigh for  pain management prn.     PT Home Exercise Plan  SKTC, lower trunk rotation SL, seated hamstring stretch, piriformis stretch seated     Consulted and Agree with Plan of Care  Patient       Patient will benefit from skilled therapeutic intervention in order to improve the following deficits and impairments:  Abnormal gait, Decreased endurance, Hypomobility, Decreased activity tolerance, Decreased strength, Pain, Difficulty walking, Increased muscle spasms, Decreased mobility, Decreased balance, Decreased range of motion, Impaired perceived functional ability, Improper body mechanics, Postural dysfunction, Impaired flexibility  Visit Diagnosis: Chronic right-sided low back pain with right-sided sciatica  Decreased strength  Decreased spinal mobility  Functional gait abnormality  Decreased functional activity tolerance     Problem List Patient Active Problem List   Diagnosis Date Noted  . DM type 2 causing vascular disease ([place]) 12/12/2016  . Class 1 obesity due to excess calories with serious comorbidity and body mass index (BMI) of 33.0 to 33.9 in adult 12/12/2016  . Mixed hyperlipidemia 09/28/2013  . Essential hypertension 09/28/2013  . CAD (coronary artery disease) 09/28/2013  . SCIATICA 10/04/2008  . BACK PAIN 10/04/2008   Starr Lake PT, DPT 6:14 PM, 03/26/17 [place] [address] [place], [place], [address] Phone: [phone]   Fax:  [phone]  Name: Alexander Avila MRN: [id number] Date of Birth: 1956/04/05

## 2017-03-29 ENCOUNTER — Encounter (HOSPITAL_COMMUNITY): Payer: Self-pay

## 2017-03-29 ENCOUNTER — Ambulatory Visit (HOSPITAL_COMMUNITY): Payer: BLUE CROSS/BLUE SHIELD

## 2017-03-29 DIAGNOSIS — R6889 Other general symptoms and signs: Secondary | ICD-10-CM | POA: Diagnosis not present

## 2017-03-29 DIAGNOSIS — G8929 Other chronic pain: Secondary | ICD-10-CM

## 2017-03-29 DIAGNOSIS — R2689 Other abnormalities of gait and mobility: Secondary | ICD-10-CM | POA: Diagnosis not present

## 2017-03-29 DIAGNOSIS — R531 Weakness: Secondary | ICD-10-CM | POA: Diagnosis not present

## 2017-03-29 DIAGNOSIS — M5441 Lumbago with sciatica, right side: Principal | ICD-10-CM

## 2017-03-29 NOTE — Therapy (Signed)
[facility] [place], [place], [address] Phone: [phone]   Fax:  [phone]  Physical Therapy Treatment  Patient Details  Name: Alexander Avila MRN: [id number] Date of Birth: Jul 18, 1955 Referring Provider: Carole Civil MD   Encounter Date: 03/29/2017  PT End of Session - 03/29/17 1843    Visit Number  2    Number of Visits  9    Date for PT Re-Evaluation  04/23/17    Authorization Type  BCBS other    Authorization Time Period  03/26/2017-04/23/2017    PT Start Time  1740    PT Stop Time  6962    PT Time Calculation (min)  53 min    Activity Tolerance  Patient limited by pain;Patient tolerated treatment well    Behavior During Therapy  [facility] for tasks assessed/performed       Past Medical History:  Diagnosis Date  . Diabetes mellitus, type II ([place])   . Heart attack ([place])   . Hypercholesteremia   . Hypertension     Past Surgical History:  Procedure Laterality Date  . stents      There were no vitals filed for this visit.  Subjective Assessment - 03/29/17 1744    Subjective  Pt stated he has constant LBP with only relief with supine position, increases with walking.  Current pain scale 5/10 with radicular symptoms down anterior Rt LE to calf.  (Pended)     Pertinent History  Heart attack 8 years ago, stents; diabetic recently diagnosed non controlled at moment   (Pended)     Patient Stated Goals  To improve pain and management   (Pended)     Currently in Pain?  Yes  (Pended)     Pain Score  5   (Pended)     Pain Location  Back  (Pended)             [facility] Adult PT Treatment/Exercise - 03/29/17 0001      Bed Mobility   Bed Mobility  Left Sidelying to Sit;Sit to Sidelying Left    Left Sidelying to Sit  5: Supervision    Left Sidelying to Sit Details (indicate cue type and reason)  Reviewed proper bed mobility    Sit to Sidelying Left  5: Supervision    Sit to Sidelying Left Details (indicate cue  type and reason)  Reviewed proper bed mobiltiy      Exercises   Exercises  Lumbar      Lumbar Exercises: Stretches   Active Hamstring Stretch  3 reps;30 seconds    Active Hamstring Stretch Limitations  supine wiht towel/rope    Single Knee to Chest Stretch  2 reps;30 seconds    Single Knee to Chest Stretch Limitations  towel assistance    Lower Trunk Rotation  10 seconds    Lower Trunk Rotation Limitations  10x 10"    Piriformis Stretch  1 rep;30 seconds    Piriformis Stretch Limitations  supine 1 rep seated for preferance      Lumbar Exercises: Standing   Other Standing Lumbar Exercises  3D hip excursion 10x (5 reps with extension- no reports of increased pain)      Lumbar Exercises: Seated   Other Seated Lumbar Exercises  educated with lumbar roll support    Other Seated Lumbar Exercises  3D thoracic excursion 5x with UE movements       Manual Therapy   Manual Therapy  Soft tissue mobilization;Manual Traction  Manual therapy comments  manual complete separate than rest of tx    Soft tissue mobilization  soft tissue mobilization to Rt piriformis and lateral LE    Manual Traction  3x1" manual traction wiht reports of relief during             PT Education - 03/29/17 1818    Education provided  Yes    Education Details  Reviewed goals, assured compliance and proper form with HEP and copy of eval given to pt.  Educated with lumbar roll for posture for pain control    Person(s) Educated  Patient    Methods  Explanation;Demonstration;Handout    Comprehension  Verbalized understanding;Returned demonstration;Need further instruction       PT Short Term Goals - 03/26/17 1809      PT SHORT TERM GOAL #1   Title  Patient will be independent with HEP to improve overall pain management and functional strength to reduce symptoms.     Time  2    Period  Weeks    Status  New    Target Date  04/09/17      PT SHORT TERM GOAL #2   Title  Patient will report at most 3/10 pain  throughout session to indicate improved tolerance to functional mobility.     Time  2    Period  Weeks    Status  New      PT SHORT TERM GOAL #3   Title  Patient will present with improved bed mechanics sit to supine and supine to sit without reliance on verbal cueing to improve protection of lumbar region.     Time  2    Period  Weeks    Status  New        PT Long Term Goals - 03/26/17 1811      PT LONG TERM GOAL #1   Title  Patient will have at least 1 grade increased with MMT testing to improve functional strength.     Time  4    Period  Weeks    Status  New    Target Date  04/23/17      PT LONG TERM GOAL #2   Title  Pt will report improved tolerance to sitting, standing and walking by at least 15 minutes to improve functional mobility at home and work.     Time  4    Period  Weeks    Status  New      PT LONG TERM GOAL #3   Title  Patient will present with improved lumbar and thoracic mobility without increase in symptoms to promote good lifting mechanics for work tolerance.     Time  4    Period  Weeks    Status  New            Plan - 03/29/17 1843    Clinical Impression Statement  Reviewed goals, assured compliance and proper form with HEP and copy of eval given to pt.  Session focus on lumbar and hip mobility with stretches and mobilty exercises.  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  EOS with manual to address tight musculature in piriformis and lateral LE.  Pt reports of relief with traction.  Noted significant Rt LE ER compared to Lt, check SI alingment next session to assure correct alignment for pain control.      Rehab Potential  Good    PT Frequency  2x /  week    PT Duration  4 weeks    PT Treatment/Interventions  ADLs/Self Care Home Management;Cryotherapy;Electrical Stimulation;Gait training;Traction;Balance training;Neuromuscular re-education;Patient/family education;Functional mobility training;Therapeutic  activities;Therapeutic exercise;Manual techniques;Passive range of motion;Dry needling;Energy conservation;Taping    PT Next Visit Plan  Next session check SI alignment.  Continue with lumbar and thoracic excursions for mobiltiy.  Begin wall slides thoracic mobility (limited by time this session).  Continue manual to piriformis and lateral thigh for pain management PRN.      PT Home Exercise Plan  SKTC, lower trunk rotation SL, seated hamstring stretch, piriformis stretch seated        Patient will benefit from skilled therapeutic intervention in order to improve the following deficits and impairments:  Abnormal gait, Decreased endurance, Hypomobility, Decreased activity tolerance, Decreased strength, Pain, Difficulty walking, Increased muscle spasms, Decreased mobility, Decreased balance, Decreased range of motion, Impaired perceived functional ability, Improper body mechanics, Postural dysfunction, Impaired flexibility  Visit Diagnosis: Chronic right-sided low back pain with right-sided sciatica  Decreased strength  Decreased spinal mobility  Functional gait abnormality  Decreased functional activity tolerance     Problem List Patient Active Problem List   Diagnosis Date Noted  . DM type 2 causing vascular disease ([place]) 12/12/2016  . Class 1 obesity due to excess calories with serious comorbidity and body mass index (BMI) of 33.0 to 33.9 in adult 12/12/2016  . Mixed hyperlipidemia 09/28/2013  . Essential hypertension 09/28/2013  . CAD (coronary artery disease) 09/28/2013  . SCIATICA 10/04/2008  . BACK PAIN 10/04/2008   Ihor Austin, [address]; [place]  Aldona Lento 03/29/2017, 6:53 PM  [facility] [address] [place], [place], [address] Phone: [phone]   Fax:  [phone]  Name: Alexander Avila MRN: [id number] Date of Birth: May 23, 1955

## 2017-03-29 NOTE — Patient Instructions (Signed)
Hamstring Step 3    Left leg in maximal straight leg raise, heel at maximal stretch, straighten knee further by tightening knee cap. Warning: Intense stretch. Stay within tolerance. Hold 30 seconds. Relax knee cap only. Repeat 3 times.  Copyright  VHI. All rights reserved.   

## 2017-04-03 ENCOUNTER — Ambulatory Visit (HOSPITAL_COMMUNITY): Payer: BLUE CROSS/BLUE SHIELD

## 2017-04-03 ENCOUNTER — Encounter (HOSPITAL_COMMUNITY): Payer: Self-pay

## 2017-04-03 DIAGNOSIS — R2689 Other abnormalities of gait and mobility: Secondary | ICD-10-CM | POA: Diagnosis not present

## 2017-04-03 DIAGNOSIS — R531 Weakness: Secondary | ICD-10-CM

## 2017-04-03 DIAGNOSIS — R6889 Other general symptoms and signs: Secondary | ICD-10-CM | POA: Diagnosis not present

## 2017-04-03 DIAGNOSIS — G8929 Other chronic pain: Secondary | ICD-10-CM

## 2017-04-03 DIAGNOSIS — M5441 Lumbago with sciatica, right side: Secondary | ICD-10-CM | POA: Diagnosis not present

## 2017-04-03 NOTE — Therapy (Signed)
[place], [place], [address] Phone: [phone]   Fax:  [phone]  Physical Therapy Treatment  Patient Details  Name: Alexander Avila MRN: [id number] Date of Birth: 1955/10/31 Referring Provider: Carole Civil MD   Encounter Date: 04/03/2017  PT End of Session - 04/03/17 1832    Visit Number  3    Number of Visits  9    Date for PT Re-Evaluation  04/23/17    Authorization Type  BC[employer] other    Authorization Time Period  03/26/2017-04/23/2017    PT Start Time  1650    PT Stop Time  1730    PT Time Calculation (min)  40 min    Activity Tolerance  Patient limited by pain;Patient tolerated treatment well    Behavior During Therapy  [facility] for tasks assessed/performed       Past Medical History:  Diagnosis Date  . Diabetes mellitus, type II ([place])   . Heart attack ([place])   . Hypercholesteremia   . Hypertension     Past Surgical History:  Procedure Laterality Date  . stents      There were no vitals filed for this visit.  Subjective Assessment - 04/03/17 1654    Subjective  Pt stated he was sore following last session.  Continues to have pain while walking, achey tightness Rt hip.  No reports of radicular symptoms today.  Reports ability to ambulate for further time with less pain today at work.  Does have relief with traction    Pertinent History  Heart attack 8 years ago, stents; diabetic recently diagnosed non controlled at moment     Patient Stated Goals  To improve pain and management     Currently in Pain?  Yes    Pain Score  4     Pain Location  Hip    Pain Orientation  Right    Pain Descriptors / Indicators  Aching;Tightness    Pain Type  Chronic pain    Pain Onset  More than a month ago    Pain Frequency  Constant    Aggravating Factors   Walking, sitting, standing, lifting anything    Pain Relieving Factors  Laying flat on back at night    Effect of Pain on Daily Activities  Working in the  yard/landscaping, doing things around the house.                        [facility] Adult PT Treatment/Exercise - 04/03/17 0001      Lumbar Exercises: Stretches   Active Hamstring Stretch  3 reps;30 seconds    Active Hamstring Stretch Limitations  supine wiht towel/rope    Single Knee to Chest Stretch  2 reps;30 seconds    Single Knee to Chest Stretch Limitations  towel assistance      Lumbar Exercises: Standing   Other Standing Lumbar Exercises  3D hip excursion 10x (5 reps with extension- no reports of increased pain)      Manual Therapy   Manual Therapy  Other (comment)    Manual therapy comments  manual complete separate than rest of tx    Soft tissue mobilization  prone Rt piriformis and gluteal muscualture    Other Manual Therapy  MET for Rt outflare               PT Short Term Goals - 03/26/17 1809      PT SHORT TERM GOAL #  1   Title  Patient will be independent with HEP to improve overall pain management and functional strength to reduce symptoms.     Time  2    Period  Weeks    Status  New    Target Date  04/09/17      PT SHORT TERM GOAL #2   Title  Patient will report at most 3/10 pain throughout session to indicate improved tolerance to functional mobility.     Time  2    Period  Weeks    Status  New      PT SHORT TERM GOAL #3   Title  Patient will present with improved bed mechanics sit to supine and supine to sit without reliance on verbal cueing to improve protection of lumbar region.     Time  2    Period  Weeks    Status  New        PT Long Term Goals - 03/26/17 1811      PT LONG TERM GOAL #1   Title  Patient will have at least 1 grade increased with MMT testing to improve functional strength.     Time  4    Period  Weeks    Status  New    Target Date  04/23/17      PT LONG TERM GOAL #2   Title  Pt will report improved tolerance to sitting, standing and walking by at least 15 minutes to improve functional mobility at home and  work.     Time  4    Period  Weeks    Status  New      PT LONG TERM GOAL #3   Title  Patient will present with improved lumbar and thoracic mobility without increase in symptoms to promote good lifting mechanics for work tolerance.     Time  4    Period  Weeks    Status  New            Plan - 04/03/17 1712    Clinical Impression Statement  Began session assessing SI alignment with noted Rt SI outflare, MET complete to improve alignment with reports of improved ability to IR Rt LE, does continue to be limited by mobility and pain.  Continued session focus with lumbar and hip mobility with stretches and excursion exercises.  Manual STM to address tightness with hips and reports of relief following pressure point to Rt piriformis with reports of relief following.      Rehab Potential  Good    PT Frequency  2x / week    PT Duration  4 weeks    PT Treatment/Interventions  ADLs/Self Care Home Management;Cryotherapy;Electrical Stimulation;Gait training;Traction;Balance training;Neuromuscular re-education;Patient/family education;Functional mobility training;Therapeutic activities;Therapeutic exercise;Manual techniques;Passive range of motion;Dry needling;Energy conservation;Taping    PT Next Visit Plan  Continue with lumbar and thoracic excursions for mobiltiy.  Begin wall slides thoracic mobility (limited by time this session).  Continue manual to piriformis and lateral thigh for pain management PRN.      PT Home Exercise Plan  SKTC, lower trunk rotation SL, seated hamstring stretch, piriformis stretch seated; 11/14 3D hip excursion       Patient will benefit from skilled therapeutic intervention in order to improve the following deficits and impairments:  Abnormal gait, Decreased endurance, Hypomobility, Decreased activity tolerance, Decreased strength, Pain, Difficulty walking, Increased muscle spasms, Decreased mobility, Decreased balance, Decreased range of motion, Impaired perceived  functional ability, Improper body mechanics, Postural dysfunction, Impaired  flexibility  Visit Diagnosis: Chronic right-sided low back pain with right-sided sciatica  Decreased strength  Decreased spinal mobility  Functional gait abnormality  Decreased functional activity tolerance     Problem List Patient Active Problem List   Diagnosis Date Noted  . DM type 2 causing vascular disease ([place]) 12/12/2016  . Class 1 obesity due to excess calories with serious comorbidity and body mass index (BMI) of 33.0 to 33.9 in adult 12/12/2016  . Mixed hyperlipidemia 09/28/2013  . Essential hypertension 09/28/2013  . CAD (coronary artery disease) 09/28/2013  . SCIATICA 10/04/2008  . BACK PAIN 10/04/2008   Ihor [place], [place]; [place]  Aldona Lento 04/03/2017, 6:35 PM  [facility] [address] [place], [place], [address] Phone: [phone]   Fax:  [phone]  Name: Alexander Avila MRN: [id number] Date of Birth: 1956-02-13

## 2017-04-05 ENCOUNTER — Telehealth: Payer: Self-pay | Admitting: Orthopedic Surgery

## 2017-04-05 ENCOUNTER — Ambulatory Visit (HOSPITAL_COMMUNITY): Payer: BLUE CROSS/BLUE SHIELD

## 2017-04-05 ENCOUNTER — Encounter (HOSPITAL_COMMUNITY): Payer: Self-pay

## 2017-04-05 DIAGNOSIS — R2689 Other abnormalities of gait and mobility: Secondary | ICD-10-CM | POA: Diagnosis not present

## 2017-04-05 DIAGNOSIS — R6889 Other general symptoms and signs: Secondary | ICD-10-CM

## 2017-04-05 DIAGNOSIS — R531 Weakness: Secondary | ICD-10-CM

## 2017-04-05 DIAGNOSIS — M5431 Sciatica, right side: Secondary | ICD-10-CM

## 2017-04-05 DIAGNOSIS — M5441 Lumbago with sciatica, right side: Principal | ICD-10-CM

## 2017-04-05 DIAGNOSIS — G8929 Other chronic pain: Secondary | ICD-10-CM | POA: Diagnosis not present

## 2017-04-05 NOTE — Therapy (Signed)
[place], [place], [address] Phone: [phone]   Fax:  [phone]  Physical Therapy Treatment  Patient Details  Name: Alexander Avila MRN: [id number] Date of Birth: [birth year] Referring Provider: Carole Civil MD   Encounter Date: 04/05/2017  PT End of Session - 04/05/17 1755    Visit Number  4    Number of Visits  9    Date for PT Re-Evaluation  04/23/17    Authorization Type  BCBS other    Authorization Time Period  03/26/2017-04/23/2017    PT Start Time  1707    PT Stop Time  1750    PT Time Calculation (min)  43 min    Activity Tolerance  Patient limited by pain;Patient tolerated treatment well    Behavior During Therapy  [facility] for tasks assessed/performed       Past Medical History:  Diagnosis Date  . Diabetes mellitus, type II ([place])   . Heart attack ([place])   . Hypercholesteremia   . Hypertension     Past Surgical History:  Procedure Laterality Date  . stents      There were no vitals filed for this visit.  Subjective Assessment - 04/05/17 1713    Subjective  Patient notes that he has about 4/10 pain. He appears with significant fatigue. He reports daughter doesn't have a nurse so he hasn't been sleeping. He notes that he may be getting an MRI next week after speaking to dr. Aline Brochure this morning.     Pain Score  4                       [facility] Adult PT Treatment/Exercise - 04/05/17 0001      Lumbar Exercises: Standing   Push / Pull Sled  Hip IR on 12" step x 10    Other Standing Lumbar Exercises  3D hip excursion 10x (5 reps with extension- no reports of increased pain)      Lumbar Exercises: Supine   Other Supine Lumbar Exercises  LTR rotation left limited due to pain in right hip x 10 therapist assist      Manual Therapy   Manual Therapy  Soft tissue mobilization;Manual Traction    Manual therapy comments  manual complete separate than rest of tx    Soft tissue  mobilization  prone Rt piriformis and gluteal muscualture    Manual Traction  long axis 2 x 30 seconds with IR/ER of hiop; P/A  rt hip in prone x 10 slow 5 sec hold    Other Manual Therapy  MRE ER Rt. LE 5x5s hold 2 sets in prone             PT Education - 04/05/17 1754    Education provided  Yes    Education Details  Sleeping with pillow under abdomen to decrease lumbar symptoms and encourage better sleep.     Person(s) Educated  Patient    Methods  Explanation;Demonstration    Comprehension  Verbalized understanding       PT Short Term Goals - 03/26/17 1809      PT SHORT TERM GOAL #1   Title  Patient will be independent with HEP to improve overall pain management and functional strength to reduce symptoms.     Time  2    Period  Weeks    Status  New    Target Date  04/09/17      PT  SHORT TERM GOAL #2   Title  Patient will report at most 3/10 pain throughout session to indicate improved tolerance to functional mobility.     Time  2    Period  Weeks    Status  New      PT SHORT TERM GOAL #3   Title  Patient will present with improved bed mechanics sit to supine and supine to sit without reliance on verbal cueing to improve protection of lumbar region.     Time  2    Period  Weeks    Status  New        PT Long Term Goals - 03/26/17 1811      PT LONG TERM GOAL #1   Title  Patient will have at least 1 grade increased with MMT testing to improve functional strength.     Time  4    Period  Weeks    Status  New    Target Date  04/23/17      PT LONG TERM GOAL #2   Title  Pt will report improved tolerance to sitting, standing and walking by at least 15 minutes to improve functional mobility at home and work.     Time  4    Period  Weeks    Status  New      PT LONG TERM GOAL #3   Title  Patient will present with improved lumbar and thoracic mobility without increase in symptoms to promote good lifting mechanics for work tolerance.     Time  4    Period  Weeks     Status  New            Plan - 04/05/17 1755    Clinical Impression Statement  Session focused primarily on hip mobility and STM to right glute/piriformis. Addition of anterior glide in prone today of the right hip with slow progression to available range stopping prior to pain and anterior glide with PROM IR/ER of the hip stopping at pain. Continued significant restrictions noted throughout glute and in piriformis mm. He continues to have reports of "relief" with distal long axis distraction of the right hip. Able to tolerate addition of active hip IR/ER today on a step. Patient reports EOS decreased symptoms.     Rehab Potential  Good    PT Frequency  2x / week    PT Duration  4 weeks    PT Treatment/Interventions  ADLs/Self Care Home Management;Cryotherapy;Electrical Stimulation;Gait training;Traction;Balance training;Neuromuscular re-education;Patient/family education;Functional mobility training;Therapeutic activities;Therapeutic exercise;Manual techniques;Passive range of motion;Dry needling;Energy conservation;Taping    PT Next Visit Plan  Continue with lumbar and thoracic excursions for mobiltiy.  Begin wall slides thoracic mobility (limited by time this session).  Continue manual to piriformis and lateral thigh for pain management PRN.      PT Home Exercise Plan  SKTC, lower trunk rotation SL, seated hamstring stretch, piriformis stretch seated; 11/14 3D hip excursion       Patient will benefit from skilled therapeutic intervention in order to improve the following deficits and impairments:  Abnormal gait, Decreased endurance, Hypomobility, Decreased activity tolerance, Decreased strength, Pain, Difficulty walking, Increased muscle spasms, Decreased mobility, Decreased balance, Decreased range of motion, Impaired perceived functional ability, Improper body mechanics, Postural dysfunction, Impaired flexibility  Visit Diagnosis: Chronic right-sided low back pain with right-sided  sciatica  Decreased strength  Decreased spinal mobility  Functional gait abnormality  Decreased functional activity tolerance     Problem List Patient Active Problem List  Diagnosis Date Noted  . DM type 2 causing vascular disease ([place]) 12/12/2016  . Class 1 obesity due to excess calories with serious comorbidity and body mass index (BMI) of 33.0 to 33.9 in adult 12/12/2016  . Mixed hyperlipidemia 09/28/2013  . Essential hypertension 09/28/2013  . CAD (coronary artery disease) 09/28/2013  . SCIATICA 10/04/2008  . BACK PAIN 10/04/2008   Starr [place] PT, DPT 5:57 PM, 04/05/17 [facility] [address] [place], [place], [address] Phone: [phone]   Fax:  [phone]  Name: JAMARE VANATTA MRN: [id number] Date of Birth: May 27, 1955

## 2017-04-05 NOTE — Telephone Encounter (Signed)
Patient called to relay he is still having pain, although taking medication as prescribed:  traMADol (ULTRAM) 50 MG tablet 60 tablet  - please advise. (ph#[phone])

## 2017-04-05 NOTE — Telephone Encounter (Addendum)
Plan was to order MRI if no improvement with NSAIDs Therapy  Called patient, we can not order MRI without return visit.  Can we call him to get him in next week on Tuesday?? Dr Aline Brochure states no more patients on Wednesday

## 2017-04-09 NOTE — Telephone Encounter (Signed)
Scheduled. Patient aware of appointment.

## 2017-04-10 ENCOUNTER — Encounter (HOSPITAL_COMMUNITY): Payer: Self-pay

## 2017-04-10 ENCOUNTER — Ambulatory Visit (HOSPITAL_COMMUNITY): Payer: BLUE CROSS/BLUE SHIELD

## 2017-04-10 DIAGNOSIS — R6889 Other general symptoms and signs: Secondary | ICD-10-CM

## 2017-04-10 DIAGNOSIS — R2689 Other abnormalities of gait and mobility: Secondary | ICD-10-CM

## 2017-04-10 DIAGNOSIS — G8929 Other chronic pain: Secondary | ICD-10-CM | POA: Diagnosis not present

## 2017-04-10 DIAGNOSIS — M5441 Lumbago with sciatica, right side: Secondary | ICD-10-CM | POA: Diagnosis not present

## 2017-04-10 DIAGNOSIS — R531 Weakness: Secondary | ICD-10-CM

## 2017-04-10 NOTE — Therapy (Signed)
[place], [place], [address] Phone: [phone]   Fax:  [phone]  Physical Therapy Treatment  Patient Details  Name: Alexander Avila MRN: [id number] Date of Birth: 03-28-1956 Referring Provider: Carole Civil MD   Encounter Date: 04/10/2017  PT End of Session - 04/10/17 1654    Visit Number  5    Number of Visits  9    Date for PT Re-Evaluation  04/23/17    Authorization Type  BCBS other    Authorization Time Period  03/26/2017-04/23/2017    PT Start Time  1645    PT Stop Time  1724    PT Time Calculation (min)  39 min    Activity Tolerance  Patient limited by pain;Patient tolerated treatment well pain scale decreased from 7/10 to 4-5/10    Behavior During Therapy  [facility] for tasks assessed/performed       Past Medical History:  Diagnosis Date  . Diabetes mellitus, type II ([place])   . Heart attack ([place])   . Hypercholesteremia   . Hypertension     Past Surgical History:  Procedure Laterality Date  . stents      There were no vitals filed for this visit.  Subjective Assessment - 04/10/17 1648    Subjective  Pt arrived wiht increased pain, stated he "pulled" the right leg while trying to slide back from desk today.  He has been watching daughter and hasn't been sleeping much.  Has apt with Dr Elisabeth Pigeon 11/28 for pain control and to discuss MRI.       Pertinent History  Heart attack 8 years ago, stents; diabetic recently diagnosed non controlled at moment     Patient Stated Goals  To improve pain and management     Currently in Pain?  Yes    Pain Score  7     Pain Location  Hip    Pain Orientation  Right    Pain Descriptors / Indicators  Aching    Pain Type  Chronic pain    Pain Radiating Towards  Rt LE down to calf    Pain Onset  More than a month ago    Pain Frequency  Constant    Aggravating Factors   walking,sitting, standing, lifting anything    Pain Relieving Factors  Laying flat on back  at night     Effect of Pain on Daily Activities  working in the yard/landscaping, doing things around the house                      [facility] Adult PT Treatment/Exercise - 04/10/17 0001      Lumbar Exercises: Stretches   Lower Trunk Rotation  10 seconds    Lower Trunk Rotation Limitations  10x 10"      Lumbar Exercises: Standing   Other Standing Lumbar Exercises  3D hip excursion 10x (5 reps with extension- no reports of increased pain)      Manual Therapy   Manual Therapy  Soft tissue mobilization;Manual Traction;Neural Stretch;Muscle Energy Technique    Manual therapy comments  manual complete separate than rest of tx    Soft tissue mobilization  prone Rt piriformis and gluteal muscualture    Other Manual Therapy  MRE ER Rt. LE 5x5s hold 2 sets in prone    Muscle Energy Technique  MET for Rt SI anterior rotation    Neural Stretch  sciatic nerve glides in supine 2 sets  PT Short Term Goals - 03/26/17 1809      PT SHORT TERM GOAL #1   Title  Patient will be independent with HEP to improve overall pain management and functional strength to reduce symptoms.     Time  2    Period  Weeks    Status  New    Target Date  04/09/17      PT SHORT TERM GOAL #2   Title  Patient will report at most 3/10 pain throughout session to indicate improved tolerance to functional mobility.     Time  2    Period  Weeks    Status  New      PT SHORT TERM GOAL #3   Title  Patient will present with improved bed mechanics sit to supine and supine to sit without reliance on verbal cueing to improve protection of lumbar region.     Time  2    Period  Weeks    Status  New        PT Long Term Goals - 03/26/17 1811      PT LONG TERM GOAL #1   Title  Patient will have at least 1 grade increased with MMT testing to improve functional strength.     Time  4    Period  Weeks    Status  New    Target Date  04/23/17      PT LONG TERM GOAL #2   Title  Pt will report  improved tolerance to sitting, standing and walking by at least 15 minutes to improve functional mobility at home and work.     Time  4    Period  Weeks    Status  New      PT LONG TERM GOAL #3   Title  Patient will present with improved lumbar and thoracic mobility without increase in symptoms to promote good lifting mechanics for work tolerance.     Time  4    Period  Weeks    Status  New            Plan - 04/10/17 1724    Clinical Impression Statement  Pt arrived with reports of increased pain today following leg getting caught underneath desk and feeling a "pull".  Session focus primarly with pain control.  Noted significant LLD, assessed SI alignment wiht noted SI anterior rotation with external rotation.  MET complete to improve SI alignment with no LLD following.  Manual soft tissue mobilization complete to address tightness in piriformis, gluteal and TFL region and nerve glides complete with reports of radicular symptoms decreased from calf to lateral hip.  Therex focus on mobility exercises to improve hip rotation for pain control.  EOS pt reports decrease in pain from 7/10 to 4-5/10.    Rehab Potential  Good    PT Frequency  2x / week    PT Duration  4 weeks    PT Treatment/Interventions  ADLs/Self Care Home Management;Cryotherapy;Electrical Stimulation;Gait training;Traction;Balance training;Neuromuscular re-education;Patient/family education;Functional mobility training;Therapeutic activities;Therapeutic exercise;Manual techniques;Passive range of motion;Dry needling;Energy conservation;Taping    PT Next Visit Plan  Continue with lumbar and thoracic excursions for mobiltiy.  Begin wall slides thoracic mobility (limited by time this session).  Continue manual to piriformis and lateral thigh for pain management PRN.  Assess SI alingnment, MET as needed.    PT Home Exercise Plan  SKTC, lower trunk rotation SL, seated hamstring stretch, piriformis stretch seated; 11/14 3D hip  excursion  Patient will benefit from skilled therapeutic intervention in order to improve the following deficits and impairments:  Abnormal gait, Decreased endurance, Hypomobility, Decreased activity tolerance, Decreased strength, Pain, Difficulty walking, Increased muscle spasms, Decreased mobility, Decreased balance, Decreased range of motion, Impaired perceived functional ability, Improper body mechanics, Postural dysfunction, Impaired flexibility  Visit Diagnosis: Chronic right-sided low back pain with right-sided sciatica  Decreased strength  Decreased spinal mobility  Functional gait abnormality  Decreased functional activity tolerance     Problem List Patient Active Problem List   Diagnosis Date Noted  . DM type 2 causing vascular disease ([place]) 12/12/2016  . Class 1 obesity due to excess calories with serious comorbidity and body mass index (BMI) of 33.0 to 33.9 in adult 12/12/2016  . Mixed hyperlipidemia 09/28/2013  . Essential hypertension 09/28/2013  . CAD (coronary artery disease) 09/28/2013  . SCIATICA 10/04/2008  . BACK PAIN 10/04/2008   Alexander Avila, [place]; [place]  Aldona Lento 04/10/2017, 5:33 PM  [facility] [address] [place], [place], [address] Phone: [phone]   Fax:  [phone]  Name: Alexander Avila MRN: [id number] Date of Birth: 04-05-1956

## 2017-04-15 ENCOUNTER — Other Ambulatory Visit: Payer: Self-pay | Admitting: Family Medicine

## 2017-04-15 DIAGNOSIS — F329 Major depressive disorder, single episode, unspecified: Secondary | ICD-10-CM | POA: Diagnosis not present

## 2017-04-15 DIAGNOSIS — I2510 Atherosclerotic heart disease of native coronary artery without angina pectoris: Secondary | ICD-10-CM | POA: Diagnosis not present

## 2017-04-15 DIAGNOSIS — E1165 Type 2 diabetes mellitus with hyperglycemia: Secondary | ICD-10-CM | POA: Diagnosis not present

## 2017-04-15 DIAGNOSIS — R591 Generalized enlarged lymph nodes: Secondary | ICD-10-CM | POA: Diagnosis not present

## 2017-04-15 DIAGNOSIS — Z6829 Body mass index (BMI) 29.0-29.9, adult: Secondary | ICD-10-CM | POA: Diagnosis not present

## 2017-04-15 DIAGNOSIS — E782 Mixed hyperlipidemia: Secondary | ICD-10-CM | POA: Diagnosis not present

## 2017-04-15 DIAGNOSIS — Z1389 Encounter for screening for other disorder: Secondary | ICD-10-CM | POA: Diagnosis not present

## 2017-04-15 DIAGNOSIS — E1159 Type 2 diabetes mellitus with other circulatory complications: Secondary | ICD-10-CM | POA: Diagnosis not present

## 2017-04-15 DIAGNOSIS — R221 Localized swelling, mass and lump, neck: Secondary | ICD-10-CM

## 2017-04-15 DIAGNOSIS — G4733 Obstructive sleep apnea (adult) (pediatric): Secondary | ICD-10-CM | POA: Diagnosis not present

## 2017-04-16 LAB — RENAL FUNCTION PANEL
ALBUMIN MSPROF: 3.5 g/dL — AB (ref 3.6–5.1)
BUN/Creatinine Ratio: 28 (calc) — ABNORMAL HIGH (ref 6–22)
BUN: 17 mg/dL (ref 7–25)
CALCIUM: 9.6 mg/dL (ref 8.6–10.3)
CHLORIDE: 99 mmol/L (ref 98–110)
CO2: 31 mmol/L (ref 20–32)
Creat: 0.6 mg/dL — ABNORMAL LOW (ref 0.70–1.33)
Glucose, Bld: 207 mg/dL — ABNORMAL HIGH (ref 65–139)
PHOSPHORUS: 3.8 mg/dL (ref 2.5–4.5)
Potassium: 4.7 mmol/L (ref 3.5–5.3)
Sodium: 137 mmol/L (ref 135–146)

## 2017-04-16 LAB — MICROALBUMIN / CREATININE URINE RATIO
CREATININE, URINE: 261 mg/dL (ref 20–320)
MICROALB/CREAT RATIO: 26 ug/mg{creat} (ref <30)
Microalb, Ur: 6.9 mg/dL

## 2017-04-16 LAB — T4, FREE: FREE T4: 1 ng/dL (ref 0.8–1.8)

## 2017-04-16 LAB — VITAMIN D 25 HYDROXY (VIT D DEFICIENCY, FRACTURES): VIT D 25 HYDROXY: 14 ng/mL — AB (ref 30–100)

## 2017-04-16 LAB — HEMOGLOBIN A1C
HEMOGLOBIN A1C: 9.3 %{Hb} — AB (ref <5.7)
MEAN PLASMA GLUCOSE: 220 (calc)
eAG (mmol/L): 12.2 (calc)

## 2017-04-16 LAB — TSH: TSH: 1.28 mIU/L (ref 0.40–4.50)

## 2017-04-17 ENCOUNTER — Ambulatory Visit (HOSPITAL_COMMUNITY): Payer: BLUE CROSS/BLUE SHIELD

## 2017-04-17 ENCOUNTER — Ambulatory Visit: Payer: BLUE CROSS/BLUE SHIELD | Admitting: Orthopedic Surgery

## 2017-04-17 ENCOUNTER — Encounter (HOSPITAL_COMMUNITY): Payer: Self-pay

## 2017-04-17 DIAGNOSIS — R531 Weakness: Secondary | ICD-10-CM | POA: Diagnosis not present

## 2017-04-17 DIAGNOSIS — G8929 Other chronic pain: Secondary | ICD-10-CM | POA: Diagnosis not present

## 2017-04-17 DIAGNOSIS — R2689 Other abnormalities of gait and mobility: Secondary | ICD-10-CM

## 2017-04-17 DIAGNOSIS — M5441 Lumbago with sciatica, right side: Principal | ICD-10-CM

## 2017-04-17 DIAGNOSIS — R6889 Other general symptoms and signs: Secondary | ICD-10-CM | POA: Diagnosis not present

## 2017-04-17 DIAGNOSIS — R070 Pain in throat: Secondary | ICD-10-CM | POA: Diagnosis not present

## 2017-04-17 DIAGNOSIS — R590 Localized enlarged lymph nodes: Secondary | ICD-10-CM | POA: Diagnosis not present

## 2017-04-17 NOTE — Therapy (Signed)
[place], [place], [address] Phone: [phone]   Fax:  [phone]  Physical Therapy Treatment  Patient Details  Name: Alexander Avila MRN: [id number] Date of Birth: [birth year] Referring Provider: Carole Civil MD   Encounter Date: 04/17/2017  PT End of Session - 04/17/17 1815    Visit Number  6    Number of Visits  9    Date for PT Re-Evaluation  04/23/17    Authorization Type  [employer] other    Authorization Time Period  03/26/2017-04/23/2017    PT Start Time  1739 pt arrived late    PT Stop Time  1813    PT Time Calculation (min)  34 min    Activity Tolerance  Patient limited by pain;Patient tolerated treatment well pain scale decreased from 7/10 to 4-5/10    Behavior During Therapy  [facility] for tasks assessed/performed       Past Medical History:  Diagnosis Date  . Diabetes mellitus, type II ([place])   . Heart attack ([place])   . Hypercholesteremia   . Hypertension     Past Surgical History:  Procedure Laterality Date  . stents      There were no vitals filed for this visit.  Subjective Assessment - 04/17/17 1741    Subjective  Pt reports that he was unable to assess further with Dr. Aline Brochure secondary to other medical issues coming up. He notes an ultrasound showing abnormal results in lymph nodes and will have to proceed with treatment. His back.hip is the same. about 4/10 pain today. he notes he is walking more at work (short walks) to exercise, but he doens't do far distances.     Pain Score  4     Pain Location  Hip    Pain Orientation  Right                      [facility] Adult PT Treatment/Exercise - 04/17/17 0001      Lumbar Exercises: Stretches   Passive Hamstring Stretch  3 reps;30 seconds      Lumbar Exercises: Standing   Push / Pull Sled  Hip abduction x 10 Rt.     Other Standing Lumbar Exercises  3D hip excursion 10x (5 reps with extension- no reports of increased pain)    Other Standing Lumbar Exercises  wall slides thoracic mobility x 10      Manual Therapy   Manual Therapy  Soft tissue mobilization;Manual Traction;Neural Stretch;Muscle Energy Technique    Manual therapy comments  manual complete separate than rest of tx    Soft tissue mobilization  prone Rt piriformis and gluteal muscualture    Other Manual Therapy  MRE ER Rt. LE 5x5s hold 2 sets in prone    Muscle Energy Technique  MR for ER in supine at end range IR x 5, 5s hold               PT Short Term Goals - 03/26/17 1809      PT SHORT TERM GOAL #1   Title  Patient will be independent with HEP to improve overall pain management and functional strength to reduce symptoms.     Time  2    Period  Weeks    Status  New    Target Date  04/09/17      PT SHORT TERM GOAL #2   Title  Patient will report at most 3/10 pain throughout session  to indicate improved tolerance to functional mobility.     Time  2    Period  Weeks    Status  New      PT SHORT TERM GOAL #3   Title  Patient will present with improved bed mechanics sit to supine and supine to sit without reliance on verbal cueing to improve protection of lumbar region.     Time  2    Period  Weeks    Status  New        PT Long Term Goals - 03/26/17 1811      PT LONG TERM GOAL #1   Title  Patient will have at least 1 grade increased with MMT testing to improve functional strength.     Time  4    Period  Weeks    Status  New    Target Date  04/23/17      PT LONG TERM GOAL #2   Title  Pt will report improved tolerance to sitting, standing and walking by at least 15 minutes to improve functional mobility at home and work.     Time  4    Period  Weeks    Status  New      PT LONG TERM GOAL #3   Title  Patient will present with improved lumbar and thoracic mobility without increase in symptoms to promote good lifting mechanics for work tolerance.     Time  4    Period  Weeks    Status  New            Plan - 04/17/17  1816    Clinical Impression Statement  Pt arrived to today's session with reports of improvement after PT sessions but continued irritation towards end of his day. He continues to have Rt. leg length longer than Lt. with supine SI test that improved slightly with prone MET for ER. Manual continues to produce the best results per pt report but tolerated added exercises well today.     Rehab Potential  Good    PT Frequency  2x / week    PT Duration  4 weeks    PT Treatment/Interventions  ADLs/Self Care Home Management;Cryotherapy;Electrical Stimulation;Gait training;Traction;Balance training;Neuromuscular re-education;Patient/family education;Functional mobility training;Therapeutic activities;Therapeutic exercise;Manual techniques;Passive range of motion;Dry needling;Energy conservation;Taping    PT Next Visit Plan  Continue manual to piriformis and lateral thigh for pain management PRN.  Assess SI alingnment, MET as needed.    PT Home Exercise Plan  SKTC, lower trunk rotation SL, seated hamstring stretch, piriformis stretch seated; 11/14 3D hip excursion       Patient will benefit from skilled therapeutic intervention in order to improve the following deficits and impairments:  Abnormal gait, Decreased endurance, Hypomobility, Decreased activity tolerance, Decreased strength, Pain, Difficulty walking, Increased muscle spasms, Decreased mobility, Decreased balance, Decreased range of motion, Impaired perceived functional ability, Improper body mechanics, Postural dysfunction, Impaired flexibility  Visit Diagnosis: Chronic right-sided low back pain with right-sided sciatica  Decreased strength  Decreased spinal mobility  Decreased functional activity tolerance  Functional gait abnormality     Problem List Patient Active Problem List   Diagnosis Date Noted  . DM type 2 causing vascular disease ([place]) 12/12/2016  . Class 1 obesity due to excess calories with serious comorbidity and body  mass index (BMI) of 33.0 to 33.9 in adult 12/12/2016  . Mixed hyperlipidemia 09/28/2013  . Essential hypertension 09/28/2013  . CAD (coronary artery disease) 09/28/2013  . SCIATICA 10/04/2008  .  BACK PAIN 10/04/2008    Starr Lake PT, DPT 6:21 PM, 04/17/17 [place], [place], [address] Phone: [phone]   Fax:  [phone]  Name: Alexander Avila MRN: [id number] Date of Birth: 1956-03-07

## 2017-04-18 ENCOUNTER — Other Ambulatory Visit (HOSPITAL_COMMUNITY): Payer: Self-pay | Admitting: Family Medicine

## 2017-04-18 DIAGNOSIS — R748 Abnormal levels of other serum enzymes: Secondary | ICD-10-CM

## 2017-04-18 DIAGNOSIS — R634 Abnormal weight loss: Secondary | ICD-10-CM

## 2017-04-19 ENCOUNTER — Other Ambulatory Visit (INDEPENDENT_AMBULATORY_CARE_PROVIDER_SITE_OTHER): Payer: Self-pay | Admitting: Otolaryngology

## 2017-04-19 ENCOUNTER — Other Ambulatory Visit: Payer: Self-pay | Admitting: Otolaryngology

## 2017-04-19 ENCOUNTER — Encounter (HOSPITAL_COMMUNITY): Payer: BLUE CROSS/BLUE SHIELD

## 2017-04-19 ENCOUNTER — Emergency Department (HOSPITAL_COMMUNITY): Payer: BLUE CROSS/BLUE SHIELD

## 2017-04-19 ENCOUNTER — Other Ambulatory Visit: Payer: Self-pay

## 2017-04-19 ENCOUNTER — Emergency Department (HOSPITAL_COMMUNITY)
Admission: EM | Admit: 2017-04-19 | Discharge: 2017-04-19 | Disposition: A | Discharge status: Home / Self Care | Payer: BLUE CROSS/BLUE SHIELD | Attending: Emergency Medicine | Admitting: Emergency Medicine

## 2017-04-19 ENCOUNTER — Encounter (HOSPITAL_COMMUNITY): Payer: Self-pay | Admitting: Emergency Medicine

## 2017-04-19 DIAGNOSIS — Z79899 Other long term (current) drug therapy: Secondary | ICD-10-CM | POA: Diagnosis not present

## 2017-04-19 DIAGNOSIS — R221 Localized swelling, mass and lump, neck: Secondary | ICD-10-CM | POA: Diagnosis not present

## 2017-04-19 DIAGNOSIS — Z7984 Long term (current) use of oral hypoglycemic drugs: Secondary | ICD-10-CM | POA: Diagnosis not present

## 2017-04-19 DIAGNOSIS — E119 Type 2 diabetes mellitus without complications: Secondary | ICD-10-CM | POA: Diagnosis not present

## 2017-04-19 DIAGNOSIS — I10 Essential (primary) hypertension: Secondary | ICD-10-CM | POA: Diagnosis not present

## 2017-04-19 DIAGNOSIS — R599 Enlarged lymph nodes, unspecified: Secondary | ICD-10-CM | POA: Diagnosis not present

## 2017-04-19 DIAGNOSIS — R590 Localized enlarged lymph nodes: Secondary | ICD-10-CM

## 2017-04-19 DIAGNOSIS — K8689 Other specified diseases of pancreas: Secondary | ICD-10-CM

## 2017-04-19 DIAGNOSIS — I2510 Atherosclerotic heart disease of native coronary artery without angina pectoris: Secondary | ICD-10-CM | POA: Diagnosis not present

## 2017-04-19 DIAGNOSIS — R5383 Other fatigue: Secondary | ICD-10-CM | POA: Insufficient documentation

## 2017-04-19 DIAGNOSIS — R918 Other nonspecific abnormal finding of lung field: Secondary | ICD-10-CM | POA: Diagnosis not present

## 2017-04-19 DIAGNOSIS — K869 Disease of pancreas, unspecified: Secondary | ICD-10-CM | POA: Insufficient documentation

## 2017-04-19 DIAGNOSIS — R869 Unspecified abnormal finding in specimens from male genital organs: Secondary | ICD-10-CM | POA: Diagnosis not present

## 2017-04-19 DIAGNOSIS — R630 Anorexia: Secondary | ICD-10-CM | POA: Diagnosis not present

## 2017-04-19 DIAGNOSIS — C259 Malignant neoplasm of pancreas, unspecified: Secondary | ICD-10-CM | POA: Diagnosis not present

## 2017-04-19 HISTORY — DX: Pain in unspecified hip: M25.559

## 2017-04-19 HISTORY — DX: Sciatica, unspecified side: M54.30

## 2017-04-19 HISTORY — DX: Dorsalgia, unspecified: M54.9

## 2017-04-19 HISTORY — DX: Other chronic pain: G89.29

## 2017-04-19 LAB — COMPREHENSIVE METABOLIC PANEL
ALBUMIN: 3.3 g/dL — AB (ref 3.5–5.0)
ALT: 41 U/L (ref 17–63)
ANION GAP: 9 (ref 5–15)
AST: 34 U/L (ref 15–41)
Alkaline Phosphatase: 183 U/L — ABNORMAL HIGH (ref 38–126)
BUN: 18 mg/dL (ref 6–20)
CO2: 27 mmol/L (ref 22–32)
Calcium: 9.3 mg/dL (ref 8.9–10.3)
Chloride: 101 mmol/L (ref 101–111)
Creatinine, Ser: 0.68 mg/dL (ref 0.61–1.24)
GFR calc Af Amer: >60 mL/min (ref >60)
GFR calc non Af Amer: >60 mL/min (ref >60)
GLUCOSE: 179 mg/dL — AB (ref 65–99)
POTASSIUM: 3.3 mmol/L — AB (ref 3.5–5.1)
SODIUM: 137 mmol/L (ref 135–145)
Total Bilirubin: 0.8 mg/dL (ref 0.3–1.2)
Total Protein: 7.2 g/dL (ref 6.5–8.1)

## 2017-04-19 LAB — CBC WITH DIFFERENTIAL/PLATELET
BASOS ABS: 0.1 10*3/uL (ref 0.0–0.1)
BASOS PCT: 0 %
EOS ABS: 1.2 10*3/uL — AB (ref 0.0–0.7)
Eosinophils Relative: 11 %
HEMATOCRIT: 39.5 % (ref 39.0–52.0)
HEMOGLOBIN: 12.6 g/dL — AB (ref 13.0–17.0)
Lymphocytes Relative: 17 %
Lymphs Abs: 1.9 10*3/uL (ref 0.7–4.0)
MCH: 28.6 pg (ref 26.0–34.0)
MCHC: 31.9 g/dL (ref 30.0–36.0)
MCV: 89.6 fL (ref 78.0–100.0)
Monocytes Absolute: 0.8 10*3/uL (ref 0.1–1.0)
Monocytes Relative: 7 %
NEUTROS ABS: 7.6 10*3/uL (ref 1.7–7.7)
NEUTROS PCT: 65 %
Platelets: 297 10*3/uL (ref 150–400)
RBC: 4.41 MIL/uL (ref 4.22–5.81)
RDW: 12.6 % (ref 11.5–15.5)
WBC: 11.6 10*3/uL — ABNORMAL HIGH (ref 4.0–10.5)

## 2017-04-19 LAB — URINALYSIS, ROUTINE W REFLEX MICROSCOPIC
BACTERIA UA: NONE SEEN
BILIRUBIN URINE: NEGATIVE
GLUCOSE, UA: NEGATIVE mg/dL
HGB URINE DIPSTICK: NEGATIVE
Ketones, ur: 20 mg/dL — AB
LEUKOCYTES UA: NEGATIVE
NITRITE: NEGATIVE
Protein, ur: 30 mg/dL — AB
SPECIFIC GRAVITY, URINE: 1.019 (ref 1.005–1.030)
pH: 6 (ref 5.0–8.0)

## 2017-04-19 LAB — LACTIC ACID, PLASMA
LACTIC ACID, VENOUS: 1.2 mmol/L (ref 0.5–1.9)
LACTIC ACID, VENOUS: 1.2 mmol/L (ref 0.5–1.9)

## 2017-04-19 LAB — LIPASE, BLOOD: Lipase: 51 U/L (ref 11–51)

## 2017-04-19 LAB — TROPONIN I: Troponin I: <0.03 ng/mL (ref <0.03)

## 2017-04-19 MED ORDER — IOPAMIDOL (ISOVUE-300) INJECTION 61%
100.0000 mL | Freq: Once | INTRAVENOUS | Status: AC | PRN
  Administered 2017-04-19: 100 mL via INTRAVENOUS

## 2017-04-19 NOTE — ED Provider Notes (Signed)
[facility] EMERGENCY DEPARTMENT Provider Note   CSN: [id number] Arrival date & time: 04/19/17  1620     History   Chief Complaint Chief Complaint  Patient presents with  . Anorexia    HPI Alexander Avila is a 61 y.o. male.     Pt was seen at 1725. Per pt, c/o gradual onset and worsening of persistent multiple symptoms for the past 1.5 months. Symptoms include: 45# unintentional weight loss, generalized fatigue, decreased appetite, and generalized abd "cramping." Pt was evaluated by his PMD for his symptoms, was told he had palpable lymph nodes in his left neck, "a high weight count," and was referred to ENT and Heme/Onc MD's. Pt states he has a biopsy of the lymph nodes scheduled for 04/30/17, but was sent to the ED today by his PMD "because I've lost more weight." Pt also reports ongoing chronic right sided lower back pain for the past 2 months. Pt has been evaluated by ED and Ortho MD for same, and has been tx with physical therapy. Denies incont/retention of bowel or bladder, no saddle anesthesia, no focal motor weakness, no tingling/numbness in extremities, no fevers, no injury, no CP/SOB, no N/V/D, no rash.   Past Medical History:  Diagnosis Date  . Chronic back pain   . Diabetes mellitus, type II ([place])   . Heart attack ([place])   . Hip pain   . Hypercholesteremia   . Hypertension   . Sciatica     Patient Active Problem List   Diagnosis Date Noted  . DM type 2 causing vascular disease ([place]) 12/12/2016  . Class 1 obesity due to excess calories with serious comorbidity and body mass index (BMI) of 33.0 to 33.9 in adult 12/12/2016  . Mixed hyperlipidemia 09/28/2013  . Essential hypertension 09/28/2013  . CAD (coronary artery disease) 09/28/2013  . SCIATICA 10/04/2008  . BACK PAIN 10/04/2008    Past Surgical History:  Procedure Laterality Date  . stents         Home Medications    Prior to Admission medications   Medication Sig Start Date End Date Taking?  Authorizing Provider  albuterol (PROVENTIL HFA;VENTOLIN HFA) 108 (90 Base) MCG/ACT inhaler Inhale 1-2 puffs into the lungs every 6 (six) hours as needed for wheezing or shortness of breath. 08/28/15   Evalee Jefferson, PA-C  aspirin 325 MG tablet Take 325 mg by mouth daily.      [provider]  atorvastatin (LIPITOR) 20 MG tablet Take 1 tablet by mouth daily. 08/26/13   [provider]  escitalopram (LEXAPRO) 20 MG tablet Take 1 tablet by mouth daily. 08/26/13   [provider]  gabapentin (NEURONTIN) 100 MG capsule Take 1 capsule (100 mg total) by mouth 3 (three) times daily. Patient not taking: Reported on 03/26/2017 03/05/17   Carole Civil, MD  glucose blood test strip Use as instructed 12/12/16   Cassandria Anger, MD  ibuprofen (ADVIL,MOTRIN) 200 MG tablet Take 400 mg by mouth every 6 (six) hours as needed for moderate pain.     [provider]  losartan-hydrochlorothiazide (HYZAAR) 50-12.5 MG per tablet Take 1 tablet by mouth daily.      [provider]  metFORMIN (GLUCOPHAGE) 1000 MG tablet Take 1 tablet by mouth 2 (two) times daily with a meal.  07/22/13   [provider]  Multiple Vitamin (ONE-A-DAY MENS PO) Take 1 tablet by mouth daily.    [provider]  nitroGLYCERIN (NITROSTAT) 0.4 MG SL tablet Place 0.4  mg under the tongue every 5 (five) minutes as needed for chest pain.    [provider]  traMADol (ULTRAM) 50 MG tablet Take 1 tablet (50 mg total) by mouth every 6 (six) hours as needed. Patient not taking: Reported on 03/26/2017 03/05/17   Carole Civil, MD  Vitamin D, Ergocalciferol, (DRISDOL) 50000 UNITS CAPS capsule Take 1 capsule by mouth once a week. Takes on Saturdays. 09/07/13   [provider]  zolpidem (AMBIEN) 10 MG tablet Take 10 mg by mouth at bedtime as needed for sleep.    [provider]    Family History Family History  Problem Relation Age of Onset  . Other Father   .  Diabetes Brother     Social History Social History   Tobacco Use  . Smoking status: Never Smoker  . Smokeless tobacco: Never Used  Substance Use Topics  . Alcohol use: Yes    Comment: occ  . Drug use: No     Allergies   Patient has no known allergies.   Review of Systems Review of Systems ROS: Statement: All systems negative except as marked or noted in the HPI; Constitutional: Negative for fever and chills. +weight loss, generalized fatigue, decreased PO intake..; ; Eyes: Negative for eye pain, redness and discharge. ; ; ENMT: +palp lymph nodes left neck. Negative for ear pain, hoarseness, nasal congestion, sinus pressure and sore throat. ; ; Cardiovascular: Negative for chest pain, palpitations, diaphoresis, dyspnea and peripheral edema. ; ; Respiratory: Negative for cough, wheezing and stridor. ; ; Gastrointestinal: Negative for nausea, vomiting, diarrhea, abdominal pain, blood in stool, hematemesis, jaundice and rectal bleeding. . ; ; Genitourinary: Negative for dysuria, flank pain and hematuria. ; ; Musculoskeletal: +chronic LBP. Negative for neck pain. Negative for swelling and trauma.; ; Skin: Negative for pruritus, rash, abrasions, blisters, bruising and skin lesion.; ; Neuro: Negative for headache, lightheadedness and neck stiffness. Negative for weakness, altered level of consciousness, altered mental status, extremity weakness, paresthesias, involuntary movement, seizure and syncope.       Physical Exam Updated Vital Signs BP (!) 127/110   Pulse (!) 106   Temp 98.4 F (36.9 C) (Oral)   Resp 14   Ht 6' (1.829 m)   Wt 94.8 kg (209 lb)   SpO2 98%   BMI 28.35 kg/m   Physical Exam 1730: Physical examination:  Nursing notes reviewed; Vital signs and O2 SAT reviewed;  Constitutional: Well developed, Well nourished, Well hydrated, In no acute distress; Head:  Normocephalic, atraumatic; Eyes: EOMI, PERRL, No scleral icterus; ENMT: Mouth and pharynx normal, Mucous membranes  moist; Neck: Supple, Full range of motion, +left neck and supraclavicular area lymphadenopathy. No overlying erythema.; Cardiovascular: Regular rate and rhythm, No gallop; Respiratory: Breath sounds clear & equal bilaterally, No wheezes.  Speaking full sentences with ease, Normal respiratory effort/excursion; Chest: Nontender, Movement normal; Abdomen: Soft, Nontender, Nondistended, Normal bowel sounds; Genitourinary: No CVA tenderness; Extremities: Pulses normal, No tenderness, No edema, No calf edema or asymmetry.; Neuro: AA&Ox3, Major CN grossly intact. No facial droop. Speech clear. No gross focal motor or sensory deficits in extremities.; Skin: Color normal, Warm, Dry.   ED Treatments / Results  Labs (all labs ordered are listed, but only abnormal results are displayed)   EKG  EKG Interpretation  Date/Time:  Friday April 19 2017 17:48:31 EST Ventricular Rate:  93 PR Interval:    QRS Duration: 101 QT Interval:  402 QTC Calculation: 500 R Axis:   -35 Text Interpretation:  Sinus rhythm Abnormal R-wave progression, early transition Inferior infarct, old Baseline wander When compared with ECG of 01/14/2011 No significant change was found Confirmed by Francine Graven [phone]) on 04/19/2017 5:51:30 PM       Radiology   Procedures Procedures (including critical care time)  Medications Ordered in ED Medications - No data to display   Initial Impression / Assessment and Plan / ED Course  I have reviewed the triage vital signs and the nursing notes.  Pertinent labs & imaging results that were available during my care of the patient were reviewed by me and considered in my medical decision making (see chart for details).  MDM Reviewed: previous chart, nursing note and vitals Reviewed previous: labs and ECG Interpretation: labs, ECG and CT scan   Results for orders placed or performed during the hospital encounter of 04/19/17  Comprehensive metabolic panel  Result Value Ref  Range   Sodium 137 135 - 145 mmol/L   Potassium 3.3 (L) 3.5 - 5.1 mmol/L   Chloride 101 101 - 111 mmol/L   CO2 27 22 - 32 mmol/L   Glucose, Bld 179 (H) 65 - 99 mg/dL   BUN 18 6 - 20 mg/dL   Creatinine, Ser 0.68 0.61 - 1.24 mg/dL   Calcium 9.3 8.9 - 10.3 mg/dL   Total Protein 7.2 6.5 - 8.1 g/dL   Albumin 3.3 (L) 3.5 - 5.0 g/dL   AST 34 15 - 41 U/L   ALT 41 17 - 63 U/L   Alkaline Phosphatase 183 (H) 38 - 126 U/L   Total Bilirubin 0.8 0.3 - 1.2 mg/dL   GFR calc non Af Amer >60 >60 mL/min   GFR calc Af Amer >60 >60 mL/min   Anion gap 9 5 - 15  Lipase, blood  Result Value Ref Range   Lipase 51 11 - 51 U/L  Troponin I  Result Value Ref Range   Troponin I <0.03 <0.03 ng/mL  Lactic acid, plasma  Result Value Ref Range   Lactic Acid, Venous 1.2 0.5 - 1.9 mmol/L  Lactic acid, plasma  Result Value Ref Range   Lactic Acid, Venous 1.2 0.5 - 1.9 mmol/L  CBC with Differential  Result Value Ref Range   WBC 11.6 (H) 4.0 - 10.5 K/uL   RBC 4.41 4.22 - 5.81 MIL/uL   Hemoglobin 12.6 (L) 13.0 - 17.0 g/dL   HCT 39.5 39.0 - 52.0 %   MCV 89.6 78.0 - 100.0 fL   MCH 28.6 26.0 - 34.0 pg   MCHC 31.9 30.0 - 36.0 g/dL   RDW 12.6 11.5 - 15.5 %   Platelets 297 150 - 400 K/uL   Neutrophils Relative % 65 %   Neutro Abs 7.6 1.7 - 7.7 K/uL   Lymphocytes Relative 17 %   Lymphs Abs 1.9 0.7 - 4.0 K/uL   Monocytes Relative 7 %   Monocytes Absolute 0.8 0.1 - 1.0 K/uL   Eosinophils Relative 11 %   Eosinophils Absolute 1.2 (H) 0.0 - 0.7 K/uL   Basophils Relative 0 %   Basophils Absolute 0.1 0.0 - 0.1 K/uL  Urinalysis, Routine w reflex microscopic  Result Value Ref Range   Color, Urine YELLOW YELLOW   APPearance CLEAR CLEAR   Specific Gravity, Urine 1.019 1.005 - 1.030   pH 6.0 5.0 - 8.0   Glucose, UA NEGATIVE NEGATIVE mg/dL   Hgb urine dipstick NEGATIVE NEGATIVE   Bilirubin Urine NEGATIVE NEGATIVE   Ketones, ur 20 (A) NEGATIVE mg/dL  Protein, ur 30 (A) NEGATIVE mg/dL   Nitrite NEGATIVE NEGATIVE    Leukocytes, UA NEGATIVE NEGATIVE   RBC / HPF 0-5 0 - 5 RBC/hpf   WBC, UA 0-5 0 - 5 WBC/hpf   Bacteria, UA NONE SEEN NONE SEEN   Squamous Epithelial / LPF 0-5 (A) NONE SEEN   Mucus PRESENT    Ct Soft Tissue Neck W Contrast Result Date: 04/19/2017 CLINICAL DATA:  61 y/o M; 15, loss of appetite, weight loss. Neck masses. EXAM: CT NECK WITH CONTRAST TECHNIQUE: Multidetector CT imaging of the neck was performed using the standard protocol following the bolus administration of intravenous contrast. CONTRAST:  167mL ISOVUE-300 IOPAMIDOL (ISOVUE-300) INJECTION 61% COMPARISON:  None. FINDINGS: Pharynx and larynx: Normal. No mass or swelling. Salivary glands: No inflammation, mass, or stone. Thyroid: Normal. Lymph nodes: Left supraclavicular and left Vb lymphadenopathy. Supraclavicular adenopathy measures 4.0 x 4.1 x 2.7 cm (AP x ML x CC series 8, image 112 and series 12, image 60). Vascular: Negative. Limited intracranial: Negative. Visualized orbits: Negative. Mastoids and visualized paranasal sinuses: Clear. 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 Multifactorial approximately moderate canal stenosis at C4-5 through C6-7 levels. Uncovertebral and facet hypertrophy encroaches on the right-sided neural foramen from C2 through T1 and left neural foramen at C3-C7. Upper chest: Negative. Other: None. IMPRESSION: Left supraclavicular and left Vb lymphadenopathy likely representing metastatic or lymphoproliferative disease. Supraclavicular adenopathy measures up to 4.1 cm. Electronically Signed   By: Kristine Garbe M.D.   On: 04/19/2017 20:10   Ct Chest W Contrast Result Date: 04/19/2017 CLINICAL DATA:  Fatigue and weight loss. EXAM: CT CHEST, ABDOMEN, AND PELVIS WITH CONTRAST TECHNIQUE: Multidetector CT imaging of the chest, abdomen and pelvis was performed following the standard protocol during bolus administration of intravenous contrast. CONTRAST:  114mL  ISOVUE-300 IOPAMIDOL (ISOVUE-300) INJECTION 61% COMPARISON:  None. FINDINGS: CT CHEST FINDINGS Cardiovascular: Mild coronary artery calcifications are seen. The thoracic aorta is nonaneurysmal with no dissection. The pulmonary arteries are normal in appearance. Mediastinum/Nodes: Adenopathy is seen in the base of the neck on the left. The CT of the neck delineates the nodes to full extent. The largest measures 4 x 4.1 cm on series 3, image 14. Numerous nodes are identified including posteriorly on series 3, image 10. No adenopathy in the axilla. A mildly prominent node to the left of the trachea on series 3, image 18 measures 8 mm. It is not enlarged by CT criteria but suspicious. No other adenopathy in the mediastinum or hila. No effusions. The thyroid and esophagus are normal. Lungs/Pleura: Central airways are normal. No pulmonary nodules, masses, or infiltrates. Musculoskeletal: See below. CT ABDOMEN PELVIS FINDINGS Hepatobiliary: Numerous masses are seen in the right and left hepatic lobes consistent with metastatic disease. Most of the masses are in the right hepatic lobe. A represent mass on series 3, image 72 measures 9.2 by 5 cm. Cholelithiasis is identified. The portal vein is patent. The splenic vein is not seen along its entire course. Pancreas: There is a mass in the pancreatic body measuring 3.7 by 4.4 cm on series 3, image 72. The mass extends posteriorly, encasing the distal celiac artery. It encases the left gastric and proximal splenic arteries as well with narrowing. It likely encases the proximal hepatic artery. The mass does not encase the SMA or SMV. The pancreatic tail is somewhat atrophic. The pancreatic head neck are unremarkable. Spleen: A low-attenuation lesion in the anterior spleen on series 3, image 70 is  subtle and nonspecific. Statistically, this is most likely benign measuring 7 mm. The spleen is otherwise normal. 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 Renal cysts are noted with no suspicious renal masses or obstruction. The ureters are unremarkable. Thickening of the bladder wall may be due to poor distention. Recommend clinical correlation. Stomach/Bowel: The stomach and small bowel are normal. The colon and appendix are normal. Vascular/Lymphatic: The abdominal aorta is nonaneurysmal as are the iliac vessels. Retroperitoneal adenopathy is identified. There is an aortocaval node on series 3, image 94 measuring 15 mm. Multiple left periaortic nodes are identified. Adenopathy extends into the left common iliac chain. No adenopathy elsewhere in the pelvis. Shotty nodes in the porta hepatis are suspicious. Reproductive: Prostate is unremarkable. Other: There is a soft tissue mass posterior to the right iliac bone as seen on series 3, images 129 and 130. This mass measures at least 8.3 cm. There is periosteal reaction in the adjacent iliac bone. A fat containing a umbilical hernia is identified. No peritoneal or omental disease is noted on today's study. Musculoskeletal: There is periosteal reaction of the right iliac bone where it abuts the adjacent soft tissue mass described above. No other acute bony abnormalities are identified. IMPRESSION: 1. 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 The malignancy encases the distal celiac artery as well as the proximal portions of the splenic, left gastric, and hepatic arteries. There is narrowing of the left gastric and splenic arteries with poor visualization of the splenic artery distally. There is also poor visualization of the left splenic vein distally. Metastatic disease is seen in the base of the neck on the left, the liver, the retroperitoneum, and the left common iliac chain. The soft tissue mass abutting the right iliac bone with periosteal reaction is consistent with a metastatic disease is well. There are shotty nodes in the porta  hepatis which are also suspicious. There is a left paratracheal node which is indeterminate but suspicious. Electronically Signed   By: Dorise Bullion III M.D   On: 04/19/2017 20:47   Ct Abdomen Pelvis W Contrast Result Date: 04/19/2017 CLINICAL DATA:  Fatigue and weight loss. EXAM: CT CHEST, ABDOMEN, AND PELVIS WITH CONTRAST TECHNIQUE: Multidetector CT imaging of the chest, abdomen and pelvis was performed following the standard protocol during bolus administration of intravenous contrast. CONTRAST:  123mL ISOVUE-300 IOPAMIDOL (ISOVUE-300) INJECTION 61% COMPARISON:  None. FINDINGS: CT CHEST FINDINGS Cardiovascular: Mild coronary artery calcifications are seen. The thoracic aorta is nonaneurysmal with no dissection. The pulmonary arteries are normal in appearance. Mediastinum/Nodes: Adenopathy is seen in the base of the neck on the left. The CT of the neck delineates the nodes to full extent. The largest measures 4 x 4.1 cm on series 3, image 14. Numerous nodes are identified including posteriorly on series 3, image 10. No adenopathy in the axilla. A mildly prominent node to the left of the trachea on series 3, image 18 measures 8 mm. It is not enlarged by CT criteria but suspicious. No other adenopathy in the mediastinum or hila. No effusions. The thyroid and esophagus are normal. Lungs/Pleura: Central airways are normal. No pulmonary nodules, masses, or infiltrates. Musculoskeletal: See below. CT ABDOMEN PELVIS FINDINGS Hepatobiliary: Numerous masses are seen in the right and left hepatic lobes consistent with metastatic disease. Most of the masses are in the right hepatic lobe. A represent mass on series 3, image 72 measures 9.2 by 5  cm. Cholelithiasis is identified. The portal vein is patent. The splenic vein is not seen along its entire course. Pancreas: There is a mass in the pancreatic body measuring 3.7 by 4.4 cm on series 3, image 72. The mass extends posteriorly, encasing the distal celiac artery. It  encases the left gastric and proximal splenic arteries as well with narrowing. It likely encases the proximal hepatic artery. The mass does not encase the SMA or SMV. The pancreatic tail is somewhat atrophic. The pancreatic head neck are unremarkable. Spleen: A low-attenuation lesion in the anterior spleen on series 3, image 70 is subtle and nonspecific. Statistically, this is most likely benign measuring 7 mm. The spleen is otherwise normal. 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  Renal cysts are noted with no suspicious renal masses or obstruction. The ureters are unremarkable. Thickening of the bladder wall may be due to poor distention. Recommend clinical correlation. Stomach/Bowel: The stomach and small bowel are normal. The colon and appendix are normal. Vascular/Lymphatic: The abdominal aorta is nonaneurysmal as are the iliac vessels. Retroperitoneal adenopathy is identified. There is an aortocaval node on series 3, image 94 measuring 15 mm. Multiple left periaortic nodes are identified. Adenopathy extends into the left common iliac chain. No adenopathy elsewhere in the pelvis. Shotty nodes in the porta hepatis are suspicious. Reproductive: Prostate is unremarkable. Other: There is a soft tissue mass posterior to the right iliac bone as seen on series 3, images 129 and 130. This mass measures at least 8.3 cm. There is periosteal reaction in the adjacent iliac bone. A fat containing a umbilical hernia is identified. No peritoneal or omental disease is noted on today's study. Musculoskeletal: There is periosteal reaction of the right iliac bone where it abuts the adjacent soft tissue mass described above. No other acute bony abnormalities are identified. IMPRESSION: 1. 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 The malignancy encases the distal celiac artery as well as the proximal portions of the  splenic, left gastric, and hepatic arteries. There is narrowing of the left gastric and splenic arteries with poor visualization of the splenic artery distally. There is also poor visualization of the left splenic vein distally. Metastatic disease is seen in the base of the neck on the left, the liver, the retroperitoneum, and the left common iliac chain. The soft tissue mass abutting the right iliac bone with periosteal reaction is consistent with a metastatic disease is well. There are shotty nodes in the porta hepatis which are also suspicious. There is a left paratracheal node which is indeterminate but suspicious. Electronically Signed   By: Dorise Bullion III M.D   On: 04/19/2017 20:47    2215:  CT as above. Labs reassuring. No clear indication for admission at this time. T/C to Onc Dr. Talbert Cage, case discussed, including:  HPI, pertinent PM/SHx, VS/PE, dx testing, ED course and treatment:  Agreeable to f/u, states have pt keep appt with ENT on 04/30/17, Onc office will call pt on Monday to schedule f/u appointment. Dx and testing, as well as d/w Onc MD, d/w pt.  Questions answered.  Verb understanding, agreeable to d/c home with outpt f/u.   Final Clinical Impressions(s) / ED Diagnoses   Final diagnoses:  Neck mass    ED Discharge Orders    None        Francine Graven, DO 04/22/17 [id number]

## 2017-04-19 NOTE — ED Notes (Signed)
Patient states that he feels better sitting in the chair than in bed.

## 2017-04-19 NOTE — Discharge Instructions (Signed)
The Oncologist's office will call you on Monday to schedule a follow up appointment. Keep your appointment with Dr. Benjamine Mola as previously scheduled on 04/30/2017.  Return to the Emergency Department immediately sooner if worsening.

## 2017-04-19 NOTE — ED Triage Notes (Signed)
Pt reports that he has been sick since oct. Pt reports he has had nerve pain and hip is out of place. Pt has been seeing therapy. Pt reports fatigue and loss of appetite. Pt reports loss of 45 lbs. Seen by Dr Hilma Favors on Monday  Ultrasound done on Monday and knots found on left side of  And elevated WBC. Biopsy scheduled for 11th and CT next week.Had had lost more weight and called and recommended to come to ED.

## 2017-04-21 LAB — URINE CULTURE: CULTURE: NO GROWTH

## 2017-04-22 ENCOUNTER — Ambulatory Visit: Payer: BLUE CROSS/BLUE SHIELD | Admitting: Orthopedic Surgery

## 2017-04-24 ENCOUNTER — Encounter (HOSPITAL_BASED_OUTPATIENT_CLINIC_OR_DEPARTMENT_OTHER): Payer: Self-pay | Admitting: *Deleted

## 2017-04-24 ENCOUNTER — Ambulatory Visit: Payer: BLUE CROSS/BLUE SHIELD | Admitting: Orthopedic Surgery

## 2017-04-25 ENCOUNTER — Encounter (HOSPITAL_COMMUNITY): Payer: BLUE CROSS/BLUE SHIELD

## 2017-04-26 ENCOUNTER — Ambulatory Visit: Payer: BLUE CROSS/BLUE SHIELD | Admitting: Orthopedic Surgery

## 2017-04-26 ENCOUNTER — Encounter (HOSPITAL_COMMUNITY)
Admission: RE | Admit: 2017-04-26 | Discharge: 2017-04-26 | Disposition: A | Discharge status: Home / Self Care | Payer: BLUE CROSS/BLUE SHIELD | Source: Ambulatory Visit | Attending: Family Medicine | Admitting: Family Medicine

## 2017-04-26 ENCOUNTER — Encounter: Payer: Self-pay | Admitting: Orthopedic Surgery

## 2017-04-26 ENCOUNTER — Encounter (HOSPITAL_COMMUNITY): Payer: Self-pay

## 2017-04-26 VITALS — BP 123/90 | HR 104 | Ht 72.0 in | Wt 212.0 lb

## 2017-04-26 DIAGNOSIS — M7989 Other specified soft tissue disorders: Secondary | ICD-10-CM

## 2017-04-26 DIAGNOSIS — M5431 Sciatica, right side: Secondary | ICD-10-CM | POA: Diagnosis not present

## 2017-04-26 DIAGNOSIS — R634 Abnormal weight loss: Secondary | ICD-10-CM | POA: Insufficient documentation

## 2017-04-26 DIAGNOSIS — R748 Abnormal levels of other serum enzymes: Secondary | ICD-10-CM

## 2017-04-26 DIAGNOSIS — M799 Soft tissue disorder, unspecified: Secondary | ICD-10-CM | POA: Diagnosis not present

## 2017-04-26 MED ORDER — TECHNETIUM TC 99M MEDRONATE IV KIT
20.0000 | PACK | Freq: Once | INTRAVENOUS | Status: AC | PRN
  Administered 2017-04-26: 20 via INTRAVENOUS

## 2017-04-26 NOTE — Progress Notes (Signed)
Progress Note   Patient ID: Alexander Avila, male   DOB: 02-18-1956, 61 y.o.   MRN: [id number]  Chief Complaint  Patient presents with  . Back Pain    better with physical therapy   . Hip Pain    right     HPI 61 year old male previously followed for right lower back pain and right leg pain treated with physical therapy anti-inflammatories.  He has improved with physical therapy although he still having pain when he is walking in his right gluteal area.  However he has had fatigue abdominal pain loss of appetite and weight loss and went to the hospital on his own or workup found pancreatic mass as well as a soft tissue mass near the iliac bone on the right which was 8 cm.  ROS see HPI   Current Meds  Medication Sig  . albuterol (PROVENTIL HFA;VENTOLIN HFA) 108 (90 Base) MCG/ACT inhaler Inhale 1-2 puffs into the lungs every 6 (six) hours as needed for wheezing or shortness of breath.  Marland Kitchen aspirin 325 MG tablet Take 325 mg by mouth daily.    Marland Kitchen atorvastatin (LIPITOR) 20 MG tablet Take 1 tablet by mouth daily.  Marland Kitchen dronabinol (MARINOL) 2.5 MG capsule   . escitalopram (LEXAPRO) 20 MG tablet Take 1 tablet by mouth daily.  Marland Kitchen glucose blood test strip Use as instructed  . HYDROcodone-acetaminophen (NORCO/VICODIN) 5-325 MG tablet   . ibuprofen (ADVIL,MOTRIN) 200 MG tablet Take 800 mg by mouth every 8 (eight) hours as needed for moderate pain.   Marland Kitchen losartan-hydrochlorothiazide (HYZAAR) 50-12.5 MG per tablet Take 1 tablet by mouth daily.    . metFORMIN (GLUCOPHAGE) 1000 MG tablet Take 1 tablet by mouth 2 (two) times daily with a meal.   . Multiple Vitamin (ONE-A-DAY MENS PO) Take 1 tablet by mouth daily.  . nitroGLYCERIN (NITROSTAT) 0.4 MG SL tablet Place 0.4 mg under the tongue every 5 (five) minutes as needed for chest pain.  . traMADol (ULTRAM) 50 MG tablet Take 1 tablet (50 mg total) by mouth every 6 (six) hours as needed.  . Vitamin D, Ergocalciferol, (DRISDOL) 50000 UNITS CAPS capsule Take 1  capsule by mouth once a week. Takes on Saturdays.  Marland Kitchen zolpidem (AMBIEN) 10 MG tablet Take 10 mg by mouth at bedtime as needed for sleep.    No Known Allergies   BP 123/90   Pulse (!) 104   Ht 6' (1.829 m)   Wt 212 lb (96.2 kg)   BMI 28.75 kg/m   Physical Exam   Medical decision-making  Encounter Diagnoses  Name Primary?  . Back pain with right-sided sciatica Yes  . Soft tissue mass      I looked at his CT scan and he indeed has a large mass near his iliac bone on the right.  It does not appear to be affecting his hip joint at all.  There is some periosteal reaction near the iliac bone as well.  He will need to see musculoskeletal oncologist   Arther Abbott, MD 04/26/2017 8:50 AM

## 2017-04-29 ENCOUNTER — Ambulatory Visit (HOSPITAL_COMMUNITY): Payer: BLUE CROSS/BLUE SHIELD | Attending: Orthopedic Surgery | Admitting: Physical Therapy

## 2017-04-30 ENCOUNTER — Encounter (HOSPITAL_BASED_OUTPATIENT_CLINIC_OR_DEPARTMENT_OTHER): Payer: Self-pay

## 2017-04-30 ENCOUNTER — Ambulatory Visit (HOSPITAL_BASED_OUTPATIENT_CLINIC_OR_DEPARTMENT_OTHER): Admit: 2017-04-30 | Payer: BLUE CROSS/BLUE SHIELD | Admitting: Otolaryngology

## 2017-04-30 SURGERY — BIOPSY, MASS, NECK
Anesthesia: General | Laterality: Left

## 2017-05-01 ENCOUNTER — Encounter (HOSPITAL_COMMUNITY): Payer: Self-pay

## 2017-05-01 ENCOUNTER — Ambulatory Visit (HOSPITAL_COMMUNITY): Payer: BLUE CROSS/BLUE SHIELD

## 2017-05-01 NOTE — Therapy (Unsigned)
[facility] [address] [place], [place], [address] Phone: [phone]   Fax:  [phone]  May 01, 2017   @CCLISTADDRESS @  Physical Therapy Discharge Summary  Patient: Alexander Avila  MRN: [id number]  Date of Birth: [birth year]   Diagnosis: No diagnosis found. Referring Provider: Carole Civil MD   The above patient had been seen in Physical Therapy 6 times of 9 treatments scheduled with 1 no shows and 2 cancellations.  The treatment consisted of manual and stretching program for pain management and strengthening for support of hip and lumbar spine to improve pain management.  The patient is: Unchanged  Subjective: Pt continued to arrive to OPPT with significant hip pain that responded to therapy slightly per patient report.   Discharge Findings: Pt will be discharged at this time secondary to wife's report of recent medical history changes. Wife and pt wish to discharge services at this time.   Functional Status at Discharge: continued hip pain and low back pain limiting quality of life throughout work and daily activities.   Unable to assess at this time as patient hasn't been seen     Sincerely,  Starr Lake PT, DPT 5:47 PM, 05/01/17 [phone]   CC @CCLISTRESTNAME @  [facility] [address] [place], [place], [address] Phone: [phone]   Fax:  [phone]  Patient: Alexander Avila  MRN: [id number]  Date of Birth: 1955/06/20

## 2017-05-02 ENCOUNTER — Encounter (HOSPITAL_COMMUNITY): Payer: BLUE CROSS/BLUE SHIELD | Attending: Adult Health | Admitting: Oncology

## 2017-05-02 ENCOUNTER — Other Ambulatory Visit: Payer: Self-pay

## 2017-05-02 ENCOUNTER — Encounter (HOSPITAL_COMMUNITY): Payer: BLUE CROSS/BLUE SHIELD

## 2017-05-02 ENCOUNTER — Encounter (HOSPITAL_COMMUNITY): Payer: Self-pay | Admitting: Oncology

## 2017-05-02 ENCOUNTER — Other Ambulatory Visit (HOSPITAL_COMMUNITY): Payer: Self-pay | Admitting: Adult Health

## 2017-05-02 VITALS — BP 133/92 | HR 96 | Temp 98.4°F | Resp 18 | Wt 211.0 lb

## 2017-05-02 DIAGNOSIS — R933 Abnormal findings on diagnostic imaging of other parts of digestive tract: Secondary | ICD-10-CM

## 2017-05-02 DIAGNOSIS — R590 Localized enlarged lymph nodes: Secondary | ICD-10-CM

## 2017-05-02 LAB — PROTIME-INR
INR: 1.15
PROTHROMBIN TIME: 14.6 s (ref 11.4–15.2)

## 2017-05-02 MED ORDER — HYDROMORPHONE HCL 2 MG PO TABS: 2.0000 mg | ORAL_TABLET | ORAL | 0 refills | Status: DC | PRN

## 2017-05-02 NOTE — Progress Notes (Signed)
[facility] Initial Visit:  Patient Care Team: Sharilyn Sites, MD as PCP - General (Family Medicine)  CHIEF COMPLAINTS/PURPOSE OF CONSULTATION: Abnormal CT scan of abdomen and chest with pancreatic mass liver masses and enlarged lymph node consistent with widespread metastatic disease    No history exists.    HISTORY OF PRESENTING ILLNESS: Alexander Avila 61 y.o. male is here because of  progressive abdominal pain not controlled with hydrocodone.  Patient is in constant pain lost 40 pounds of weight.  Having severe pain in the back and the right hip.  Patient was found to have left supraclavicular mass was admitted in the hospital because of uncontrolled pain where CT scan of abdomen revealed multiple liver metastases and a pancreatic mass and multiple enlarged lymph node.  Patient was scheduled for biopsy of the left supraclavicular mass which has been canceled.  Patient is here for ongoing evaluation and planning of treatment. His main issue right now is significant weight loss.  Severe abdominal pain not controlled with hydrocodone.  Review of Systems - Oncology GENERAL: Patient is in mild distress because of pain..  Patient's appetite is poor and has lost significant amount of weight PERFORMANCE STATUS (ECOG):  0 HEENT:  No visual changes, runny nose, sore throat, mouth sores or tenderness. Lungs: No shortness of breath or cough.  No hemoptysis. Cardiac:  No chest pain, palpitations, orthopnea, or PND. GI: Significant abdominal pain and upper abdominal area.  Constant dull aching not controlled with hydrocodone.  No upper or lower GI bleeding.  POOR APPETITE GU:  No urgency, frequency, dysuria, or hematuria. Musculoskeletal:  No back pain.  No joint pain.  No muscle tenderness. Extremities:  No pain or swelling. Skin:  No rashes or skin changes. Neuro:  No headache, numbness or weakness, balance or coordination issues. Endocrine:  No diabetes, thyroid issues, hot  flashes or night sweats. Psych:  No mood changes, depression or anxiety. Pain:  No focal pain. Review of systems:  All other systems reviewed and found to be negative. MEDICAL HISTORY: Past Medical History:  Diagnosis Date  . Chronic back pain   . Diabetes mellitus, type II ([place])   . Heart attack ([place])   . Hip pain   . Hypercholesteremia   . Hypertension   . Sciatica     SURGICAL HISTORY: Past Surgical History:  Procedure Laterality Date  . stents      SOCIAL HISTORY: Social History   Socioeconomic History  . Marital status: Married    Spouse name: Not on file  . Number of children: Not on file  . Years of education: Not on file  . Highest education level: Not on file  Social Needs  . Financial resource strain: Not on file  . Food insecurity - worry: Not on file  . Food insecurity - inability: Not on file  . Transportation needs - medical: Not on file  . Transportation needs - non-medical: Not on file  Occupational History  . Not on file  Tobacco Use  . Smoking status: Never Smoker  . Smokeless tobacco: Never Used  Substance and Sexual Activity  . Alcohol use: Yes    Comment: occ  . Drug use: No  . Sexual activity: Yes  Other Topics Concern  . Not on file  Social History Narrative  . Not on file    FAMILY HISTORY Family History  Problem Relation Age of Onset  . Other Father   . Diabetes Brother  ALLERGIES:  has No Known Allergies.  MEDICATIONS:  Current Outpatient Medications  Medication Sig Dispense Refill  . albuterol (PROVENTIL HFA;VENTOLIN HFA) 108 (90 Base) MCG/ACT inhaler Inhale 1-2 puffs into the lungs every 6 (six) hours as needed for wheezing or shortness of breath. 1 Inhaler 0  . aspirin 325 MG tablet Take 325 mg by mouth daily.      Marland Kitchen atorvastatin (LIPITOR) 20 MG tablet Take 1 tablet by mouth daily.    Marland Kitchen dronabinol (MARINOL) 2.5 MG capsule     . escitalopram (LEXAPRO) 20 MG tablet Take 1 tablet by mouth daily.    Marland Kitchen gabapentin  (NEURONTIN) 100 MG capsule Take 1 capsule (100 mg total) by mouth 3 (three) times daily. (Patient not taking: Reported on 03/26/2017) 90 capsule 2  . glucose blood test strip Use as instructed 150 each 2  . HYDROcodone-acetaminophen (NORCO/VICODIN) 5-325 MG tablet     . ibuprofen (ADVIL,MOTRIN) 200 MG tablet Take 800 mg by mouth every 8 (eight) hours as needed for moderate pain.     Marland Kitchen losartan-hydrochlorothiazide (HYZAAR) 50-12.5 MG per tablet Take 1 tablet by mouth daily.      . metFORMIN (GLUCOPHAGE) 1000 MG tablet Take 1 tablet by mouth 2 (two) times daily with a meal.     . Multiple Vitamin (ONE-A-DAY MENS PO) Take 1 tablet by mouth daily.    . nitroGLYCERIN (NITROSTAT) 0.4 MG SL tablet Place 0.4 mg under the tongue every 5 (five) minutes as needed for chest pain.    . traMADol (ULTRAM) 50 MG tablet Take 1 tablet (50 mg total) by mouth every 6 (six) hours as needed. 60 tablet 0  . Vitamin D, Ergocalciferol, (DRISDOL) 50000 UNITS CAPS capsule Take 1 capsule by mouth once a week. Takes on Saturdays.    Marland Kitchen zolpidem (AMBIEN) 10 MG tablet Take 10 mg by mouth at bedtime as needed for sleep.     No current facility-administered medications for this visit.     PHYSICAL EXAMINATION: GENERAL: Patient has lost significant mild distress because of pain MENTAL STATUS:  Alert and oriented to person, place and time. HEAD:   Normocephalic, atraumatic, face symmetric, no Cushingoid features. EYES: Pupils equal round and reactive to light and accomodation.  No conjunctivitis or scleral icterus. ENT:  Oropharynx clear without lesion.  Tongue normal. Mucous membranes moist.  RESPIRATORY:  Clear to auscultation without rales, wheezes or rhonchi. CARDIOVASCULAR:  Regular rate and rhythm without murmur, rub or gallop.  ABDOMEN: Palpable liver below costal margin (`1-2 finger) no other abdominal masses tenderness in epigastric area. BACK:  No CVA tenderness.  No tenderness on percussion of the back or rib  cage. SKIN:  No rashes, ulcers or lesions. EXTREMITIES: No edema, no skin discoloration or tenderness.  No palpable cords. LYMPH NODES: Palpable left supraclavicular lymph node 3 x 4 cm hard multiple other lymphadenopathy in the neck NEUROLOGICAL: Unremarkable. PSYCH:  Appropriate.  General  status: Performance status is good.  Patient has not lost significant weight.    ECOG PERFORMANCE STATUS: 1 - Symptomatic but completely ambulatory   There were no vitals filed for this visit.  There were no vitals filed for this visit.   Physical Exam   LABORATORY DATA: I have personally reviewed the data as listed:  Admission on 04/19/2017, Discharged on 04/19/2017  Component Date Value Ref Range Status  . Sodium 04/19/2017 137  135 - 145 mmol/L Final  . Potassium 04/19/2017 3.3* 3.5 - 5.1 mmol/L Final  . Chloride 04/19/2017  101  101 - 111 mmol/L Final  . CO2 04/19/2017 27  22 - 32 mmol/L Final  . Glucose, Bld 04/19/2017 179* 65 - 99 mg/dL Final  . BUN 04/19/2017 18  6 - 20 mg/dL Final  . Creatinine, Ser 04/19/2017 0.68  0.61 - 1.24 mg/dL Final  . Calcium 04/19/2017 9.3  8.9 - 10.3 mg/dL Final  . Total Protein 04/19/2017 7.2  6.5 - 8.1 g/dL Final  . Albumin 04/19/2017 3.3* 3.5 - 5.0 g/dL Final  . AST 04/19/2017 34  15 - 41 U/L Final  . ALT 04/19/2017 41  17 - 63 U/L Final  . Alkaline Phosphatase 04/19/2017 183* 38 - 126 U/L Final  . Total Bilirubin 04/19/2017 0.8  0.3 - 1.2 mg/dL Final  . GFR calc non Af Amer 04/19/2017 >60  >60 mL/min Final  . GFR calc Af Amer 04/19/2017 >60  >60 mL/min Final   Comment: (NOTE) The eGFR has been calculated using the CKD EPI equation. This calculation has not been validated in all clinical situations. eGFR's persistently <60 mL/min signify possible Chronic Kidney Disease.   . Anion gap 04/19/2017 9  5 - 15 Final  . Lipase 04/19/2017 51  11 - 51 U/L Final  . Troponin I 04/19/2017 <0.03  <0.03 ng/mL Final  . Lactic Acid, Venous 04/19/2017 1.2  0.5  - 1.9 mmol/L Final  . Lactic Acid, Venous 04/19/2017 1.2  0.5 - 1.9 mmol/L Final  . WBC 04/19/2017 11.6* 4.0 - 10.5 K/uL Final  . RBC 04/19/2017 4.41  4.22 - 5.81 MIL/uL Final  . Hemoglobin 04/19/2017 12.6* 13.0 - 17.0 g/dL Final  . HCT 04/19/2017 39.5  39.0 - 52.0 % Final  . MCV 04/19/2017 89.6  78.0 - 100.0 fL Final  . MCH 04/19/2017 28.6  26.0 - 34.0 pg Final  . MCHC 04/19/2017 31.9  30.0 - 36.0 g/dL Final  . RDW 04/19/2017 12.6  11.5 - 15.5 % Final  . Platelets 04/19/2017 297  150 - 400 K/uL Final  . Neutrophils Relative % 04/19/2017 65  % Final  . Neutro Abs 04/19/2017 7.6  1.7 - 7.7 K/uL Final  . Lymphocytes Relative 04/19/2017 17  % Final  . Lymphs Abs 04/19/2017 1.9  0.7 - 4.0 K/uL Final  . Monocytes Relative 04/19/2017 7  % Final  . Monocytes Absolute 04/19/2017 0.8  0.1 - 1.0 K/uL Final  . Eosinophils Relative 04/19/2017 11  % Final  . Eosinophils Absolute 04/19/2017 1.2* 0.0 - 0.7 K/uL Final  . Basophils Relative 04/19/2017 0  % Final  . Basophils Absolute 04/19/2017 0.1  0.0 - 0.1 K/uL Final  . Specimen Description 04/19/2017 URINE, CLEAN CATCH   Final  . Special Requests 04/19/2017 NONE   Final  . Culture 04/19/2017    Final                   Value:NO GROWTH Performed at [facility], [place] [address] [place], [place] [address]   . Report Status 04/19/2017 04/21/2017 FINAL   Final  . Color, Urine 04/19/2017 YELLOW  YELLOW Final  . APPearance 04/19/2017 CLEAR  CLEAR Final  . Specific Gravity, Urine 04/19/2017 1.019  1.005 - 1.030 Final  . pH 04/19/2017 6.0  5.0 - 8.0 Final  . Glucose, UA 04/19/2017 NEGATIVE  NEGATIVE mg/dL Final  . Hgb urine dipstick 04/19/2017 NEGATIVE  NEGATIVE Final  . Bilirubin Urine 04/19/2017 NEGATIVE  NEGATIVE Final  . Ketones, ur 04/19/2017 20* NEGATIVE mg/dL Final  .  Protein, ur 04/19/2017 30* NEGATIVE mg/dL Final  . Nitrite 04/19/2017 NEGATIVE  NEGATIVE Final  . Leukocytes, UA 04/19/2017 NEGATIVE  NEGATIVE Final  . RBC / HPF  04/19/2017 0-5  0 - 5 RBC/hpf Final  . WBC, UA 04/19/2017 0-5  0 - 5 WBC/hpf Final  . Bacteria, UA 04/19/2017 NONE SEEN  NONE SEEN Final  . Squamous Epithelial / LPF 04/19/2017 0-5* NONE SEEN Final  . Mucus 04/19/2017 PRESENT   Final    RADIOGRAPHIC STUDIES: I have personally reviewed the radiological images as listed and agree with the findings in the report CT scan of abdomen chest has been reviewed as well as bone scan has been reviewed by me personally as well as reviewed with the patient.  Patient has a mid pancreatic mass.  Had a mixed soft tissue mass in the left supraclavicular area.  And multiple liver metastases.   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  Biopsy has been arranged with radiology department with ultrasound-guided soft tissue mass core biopsy. Tumor markers with CEA and CA 19-has been obtained Pro time is been obtained Patient has been asked to stop aspirin few days prior to biopsy Dilaudid was given for pain Patient was advised to stop hydrocodone Situation has been discussed with nurse practitioner to follow-up on biopsy result  All questions were answered. The patient knows to call the clinic with any problems, questions or concerns.  This note was electronically signed.    Forest Gleason, MD  05/02/2017 2:30 PM

## 2017-05-02 NOTE — Patient Instructions (Signed)
[facility] at [facility] Discharge Instructions  RECOMMENDATIONS MADE BY THE CONSULTANT AND ANY TEST RESULTS WILL BE SENT TO YOUR REFERRING PHYSICIAN.  You were seen today by Dr. Janak Choksi   Thank you for choosing [facility] at [facility] to provide your oncology and hematology care.  To afford each patient quality time with our provider, please arrive at least 15 minutes before your scheduled appointment time.    If you have a lab appointment with the Cancer Center please come in thru the  Main Entrance and check in at the main information desk  You need to re-schedule your appointment should you arrive 10 or more minutes late.  We strive to give you quality time with our providers, and arriving late affects you and other patients whose appointments are after yours.  Also, if you no show three or more times for appointments you may be dismissed from the clinic at the providers discretion.     Again, thank you for choosing [facility].  Our hope is that these requests will decrease the amount of time that you wait before being seen by our physicians.       _____________________________________________________________  Should you have questions after your visit to [facility], please contact our office at [phone] between the hours of 8:30 a.m. and 4:30 p.m.  Voicemails left after 4:30 p.m. will not be returned until the following business day.  For prescription refill requests, have your pharmacy contact our office.       Resources For Cancer Patients and their Caregivers ? American Cancer Society: Can assist with transportation, wigs, general needs, runs Look Good Feel Better.        1-888-227-6333 ? Cancer Care: Provides financial assistance, online support groups, medication/co-pay assistance.  1-800-813-HOPE (4673) ? [facility] Assists [place] cancer patients and their  families through emotional , educational and financial support.  336-427-4357 ? [place] DSS Where to apply for food stamps, Medicaid and utility assistance. 336-342-1394 ? [facility]: Transportation to medical appointments. 336-347-2287 ? Social Security Administration: May apply for disability if have a Stage IV cancer. 336-342-7796 1-800-772-1213 ? [place] Aging, Disability and Transit Services: Assists with nutrition, care and transit needs. 336-349-2343  Cancer Center Support Programs: @10RELATIVEDAYS@ > Cancer Support Group  2nd Tuesday of the month 1pm-2pm, Journey Room  > Creative Journey  3rd Tuesday of the month 1130am-1pm, Journey Room  > Look Good Feel Better  1st Wednesday of the month 10am-12 noon, Journey Room (Call American Cancer Society to register 1-800-395-5775)    

## 2017-05-03 ENCOUNTER — Other Ambulatory Visit (HOSPITAL_COMMUNITY): Payer: Self-pay | Admitting: Emergency Medicine

## 2017-05-03 LAB — CANCER ANTIGEN 19-9: CAN 19-9: 5007 U/mL — AB (ref 0–35)

## 2017-05-03 LAB — CEA: CEA1: 14623 ng/mL — AB (ref 0.0–4.7)

## 2017-05-03 MED ORDER — CYCLOBENZAPRINE HCL 10 MG PO TABS: 10.0000 mg | ORAL_TABLET | Freq: Three times a day (TID) | ORAL | 1 refills | Status: DC | PRN

## 2017-05-03 NOTE — Progress Notes (Signed)
Pt called and stated that his dilaudid was helping his pain, But he was having problems with his back tightening up.  He wanted to know if he could get a muscle relaxer.  Spoke with Mike Craze NP and called in flexeril 10 mg TID as needed.  Start with 5 mg (half tablet to see how it will affect you to begin with).  Verbalized understanding.

## 2017-05-06 ENCOUNTER — Ambulatory Visit: Payer: BLUE CROSS/BLUE SHIELD | Admitting: "Endocrinology

## 2017-05-08 ENCOUNTER — Inpatient Hospital Stay (HOSPITAL_COMMUNITY): Admission: RE | Admit: 2017-05-08 | Payer: BLUE CROSS/BLUE SHIELD | Source: Ambulatory Visit

## 2017-05-08 ENCOUNTER — Ambulatory Visit (HOSPITAL_COMMUNITY)
Admission: RE | Admit: 2017-05-08 | Discharge: 2017-05-08 | Disposition: A | Discharge status: Home / Self Care | Payer: BLUE CROSS/BLUE SHIELD | Source: Ambulatory Visit | Attending: Adult Health | Admitting: Adult Health

## 2017-05-08 ENCOUNTER — Ambulatory Visit (HOSPITAL_COMMUNITY): Payer: BLUE CROSS/BLUE SHIELD | Admitting: Adult Health

## 2017-05-08 DIAGNOSIS — R590 Localized enlarged lymph nodes: Secondary | ICD-10-CM

## 2017-05-09 ENCOUNTER — Other Ambulatory Visit (HOSPITAL_COMMUNITY): Payer: Self-pay | Admitting: Adult Health

## 2017-05-09 ENCOUNTER — Encounter (HOSPITAL_COMMUNITY): Payer: Self-pay | Admitting: Adult Health

## 2017-05-09 DIAGNOSIS — K8689 Other specified diseases of pancreas: Secondary | ICD-10-CM

## 2017-05-09 MED ORDER — HYDROMORPHONE HCL 2 MG PO TABS: ORAL_TABLET | ORAL | 0 refills | Status: DC

## 2017-05-09 NOTE — Progress Notes (Signed)
Patient called cancer center requesting refill of Dilaudid.   [place] Controlled Substance Reporting System reviewed and refill is appropriate on or after 05/09/17. Paper prescription printed & post-dated; Rx left at cancer center front desk for patient to retrieve after showing photo ID per clinic policy.   [place] reviewed:     Mike Craze, NP [facility] [phone]

## 2017-05-10 ENCOUNTER — Other Ambulatory Visit: Payer: Self-pay | Admitting: Radiology

## 2017-05-13 ENCOUNTER — Encounter (HOSPITAL_COMMUNITY): Payer: Self-pay

## 2017-05-13 ENCOUNTER — Ambulatory Visit (HOSPITAL_COMMUNITY)
Admission: RE | Admit: 2017-05-13 | Discharge: 2017-05-13 | Disposition: A | Discharge status: Home / Self Care | Payer: BLUE CROSS/BLUE SHIELD | Source: Ambulatory Visit | Attending: Adult Health | Admitting: Adult Health

## 2017-05-13 DIAGNOSIS — G8929 Other chronic pain: Secondary | ICD-10-CM | POA: Insufficient documentation

## 2017-05-13 DIAGNOSIS — R590 Localized enlarged lymph nodes: Secondary | ICD-10-CM | POA: Insufficient documentation

## 2017-05-13 DIAGNOSIS — C770 Secondary and unspecified malignant neoplasm of lymph nodes of head, face and neck: Secondary | ICD-10-CM | POA: Diagnosis not present

## 2017-05-13 DIAGNOSIS — E7800 Pure hypercholesterolemia, unspecified: Secondary | ICD-10-CM | POA: Insufficient documentation

## 2017-05-13 DIAGNOSIS — R221 Localized swelling, mass and lump, neck: Secondary | ICD-10-CM | POA: Insufficient documentation

## 2017-05-13 DIAGNOSIS — I10 Essential (primary) hypertension: Secondary | ICD-10-CM | POA: Insufficient documentation

## 2017-05-13 DIAGNOSIS — C787 Secondary malignant neoplasm of liver and intrahepatic bile duct: Secondary | ICD-10-CM | POA: Diagnosis not present

## 2017-05-13 DIAGNOSIS — C259 Malignant neoplasm of pancreas, unspecified: Secondary | ICD-10-CM | POA: Diagnosis not present

## 2017-05-13 DIAGNOSIS — E119 Type 2 diabetes mellitus without complications: Secondary | ICD-10-CM | POA: Insufficient documentation

## 2017-05-13 DIAGNOSIS — C801 Malignant (primary) neoplasm, unspecified: Secondary | ICD-10-CM | POA: Diagnosis not present

## 2017-05-13 DIAGNOSIS — K869 Disease of pancreas, unspecified: Secondary | ICD-10-CM | POA: Diagnosis not present

## 2017-05-13 LAB — CBC
HCT: 39.1 % (ref 39.0–52.0)
HEMOGLOBIN: 12.8 g/dL — AB (ref 13.0–17.0)
MCH: 28.9 pg (ref 26.0–34.0)
MCHC: 32.7 g/dL (ref 30.0–36.0)
MCV: 88.3 fL (ref 78.0–100.0)
Platelets: 327 10*3/uL (ref 150–400)
RBC: 4.43 MIL/uL (ref 4.22–5.81)
RDW: 13.7 % (ref 11.5–15.5)
WBC: 15.2 10*3/uL — ABNORMAL HIGH (ref 4.0–10.5)

## 2017-05-13 LAB — PROTIME-INR
INR: 1.24
PROTHROMBIN TIME: 15.5 s — AB (ref 11.4–15.2)

## 2017-05-13 LAB — APTT: aPTT: 33 seconds (ref 24–36)

## 2017-05-13 LAB — GLUCOSE, CAPILLARY: Glucose-Capillary: 155 mg/dL — ABNORMAL HIGH (ref 65–99)

## 2017-05-13 MED ORDER — FENTANYL CITRATE (PF) 100 MCG/2ML IJ SOLN
INTRAMUSCULAR | Status: AC | PRN
  Administered 2017-05-13 (×2): 25 ug via INTRAVENOUS
  Administered 2017-05-13: 50 ug via INTRAVENOUS

## 2017-05-13 MED ORDER — SODIUM CHLORIDE 0.9 % IV SOLN: INTRAVENOUS | Status: DC

## 2017-05-13 MED ORDER — MIDAZOLAM HCL 2 MG/2ML IJ SOLN
INTRAMUSCULAR | Status: AC | PRN
  Administered 2017-05-13: 1 mg via INTRAVENOUS
  Administered 2017-05-13 (×2): 0.5 mg via INTRAVENOUS

## 2017-05-13 MED ORDER — LIDOCAINE HCL (PF) 1 % IJ SOLN
INTRAMUSCULAR | Status: AC
  Filled 2017-05-13: qty 30

## 2017-05-13 MED ORDER — MIDAZOLAM HCL 2 MG/2ML IJ SOLN
INTRAMUSCULAR | Status: AC
Start: 2017-05-13 — End: 2017-05-13
  Filled 2017-05-13: qty 2

## 2017-05-13 MED ORDER — SODIUM CHLORIDE 0.9 % IV SOLN
INTRAVENOUS | Status: AC | PRN
  Administered 2017-05-13: 10 mL/h via INTRAVENOUS

## 2017-05-13 MED ORDER — FENTANYL CITRATE (PF) 100 MCG/2ML IJ SOLN
INTRAMUSCULAR | Status: AC
  Filled 2017-05-13: qty 2

## 2017-05-13 NOTE — Discharge Instructions (Addendum)
Needle Biopsy, Care After °Refer to this sheet in the next few weeks. These instructions provide you with information about caring for yourself after your procedure. Your health care provider may also give you more specific instructions. Your treatment has been planned according to current medical practices, but problems sometimes occur. Call your health care provider if you have any problems or questions after your procedure. °What can I expect after the procedure? °After your procedure, it is common to have soreness, bruising, or mild pain at the biopsy site. This should go away in a few days. °Follow these instructions at home: °· Rest as directed by your health care provider. °· Take medicines only as directed by your health care provider. °· There are many different ways to close and cover the biopsy site, including stitches (sutures), skin glue, and adhesive strips. Follow your health care provider's instructions about: °? Biopsy site care. °? Bandage (dressing) changes and removal. °? Biopsy site closure removal. °· Check your biopsy site every day for signs of infection. Watch for: °? Redness, swelling, or pain. °? Fluid, blood, or pus. °Contact a health care provider if: °· You have a fever. °· You have redness, swelling, or pain at the biopsy site that lasts longer than a few days. °· You have fluid, blood, or pus coming from the biopsy site. °· You feel nauseous. °· You vomit. °Get help right away if: °· You have shortness of breath. °· You have trouble breathing. °· You have chest pain. °· You feel dizzy or you faint. °· You have bleeding that does not stop with pressure or a bandage. °· You cough up blood. °· You have pain in your abdomen. °This information is not intended to replace advice given to you by your health care provider. Make sure you discuss any questions you have with your health care provider. °Document Released: 09/21/2014 Document Revised: 10/13/2015 Document Reviewed:  05/03/2014 °Elsevier Interactive Patient Education © 2018 [employer] Inc[employer] °Moderate Conscious Sedation, Adult, Care After °These instructions provide you with information about caring for yourself after your procedure. Your health care provider may also give you more specific instructions. Your treatment has been planned according to current medical practices, but problems sometimes occur. Call your health care provider if you have any problems or questions after your procedure. °What can I expect after the procedure? °After your procedure, it is common: °· To feel sleepy for several hours. °· To feel clumsy and have poor balance for several hours. °· To have poor judgment for several hours. °· To vomit if you eat too soon. ° °Follow these instructions at home: °For at least 24 hours after the procedure: ° °· Do not: °? Participate in activities where you could fall or become injured. °? Drive. °? Use heavy machinery. °? Drink alcohol. °? Take sleeping pills or medicines that cause drowsiness. °? Make important decisions or sign legal documents. °? Take care of children on your own. °· Rest. °Eating and drinking °· Follow the diet recommended by your health care provider. °· If you vomit: °? Drink water, juice, or soup when you can drink without vomiting. °? Make sure you have little or no nausea before eating solid foods. °General instructions °· Have a responsible adult stay with you until you are awake and alert. °· Take over-the-counter and prescription medicines only as told by your health care provider. °· If you smoke, do not smoke without supervision. °· Keep all follow-up visits as told by your health care   provider. This is important. °Contact a health care provider if: °· You keep feeling nauseous or you keep vomiting. °· You feel light-headed. °· You develop a rash. °· You have a fever. °Get help right away if: °· You have trouble breathing. °This information is not intended to replace advice given to you  by your health care provider. Make sure you discuss any questions you have with your health care provider. °Document Released: 02/25/2013 Document Revised: 10/10/2015 Document Reviewed: 08/27/2015 °Elsevier Interactive Patient Education © 2018 [employer]vier Inc. ° °

## 2017-05-13 NOTE — H&P (Signed)
Chief Complaint: Patient was seen in consultation today for left supraclavicular lymph node biopsy at the request of Dawson,Gretchen W  Referring Physician(s): Dawson,Gretchen W Dr Virgina Jock  Supervising Physician: Markus Daft  Patient Status: [facility] - Out-pt  History of Present Illness: Alexander Avila is a 61 y.o. male   Presumed pancreatic cancer Wt loss abd pain; R hip pain Elevated Alk Phos  CT 11/30: IMPRESSION: 1. 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 The malignancy encases the distal celiac artery as well as the proximal portions of the splenic, left gastric, and hepatic arteries. There is narrowing of the left gastric and splenic arteries with poor visualization of the splenic artery distally. There is also poor visualization of the left splenic vein distally. Metastatic disease is seen in the base of the neck on the left, the liver, the retroperitoneum, and the left common iliac chain. The soft tissue mass abutting the right iliac bone with periosteal reaction is consistent with a metastatic disease is well. There are shotty nodes in the porta hepatis which are also suspicious. There is a left paratracheal node which is indeterminate but suspicious.  05/02/17 Dr Oliva Bustard note: Clinically patient has pancreatic cancer metastases to liver and left supraclavicular lymph node however further biopsies needed.  Biopsy has been arranged with radiology department with ultrasound-guided soft tissue mass core biopsy.  Scheduled now for Left SCLN bx  Past Medical History:  Diagnosis Date  . Chronic back pain   . Diabetes mellitus, type II ([place])   . Heart attack ([place])   . Hip pain   . Hypercholesteremia   . Hypertension   . Sciatica     Past Surgical History:  Procedure Laterality Date  . stents      Allergies: Patient has no known allergies.  Medications: Prior to Admission medications   Medication  Sig Start Date End Date Taking? Authorizing Provider  aspirin 325 MG tablet Take 325 mg by mouth daily.     Yes [provider]  atorvastatin (LIPITOR) 20 MG tablet Take 1 tablet by mouth daily. 08/26/13  Yes [provider]  cyclobenzaprine (FLEXERIL) 10 MG tablet Take 1 tablet (10 mg total) by mouth 3 (three) times daily as needed for muscle spasms. 05/03/17  Yes Holley Bouche, NP  dronabinol (MARINOL) 2.5 MG capsule Take 2.5 mg by mouth 2 (two) times daily before lunch and supper.  04/22/17  Yes [provider]  escitalopram (LEXAPRO) 20 MG tablet Take 1 tablet by mouth daily. 08/26/13  Yes [provider]  HYDROmorphone (DILAUDID) 2 MG tablet Take 1 tab every 4 hours as needed for severe pain. 05/09/17  Yes Holley Bouche, NP  ibuprofen (ADVIL,MOTRIN) 200 MG tablet Take 800 mg by mouth every 8 (eight) hours as needed for moderate pain.    Yes [provider]  metFORMIN (GLUCOPHAGE) 1000 MG tablet Take 1 tablet by mouth 2 (two) times daily with a meal.  07/22/13  Yes [provider]  Multiple Vitamin (ONE-A-DAY MENS PO) Take 1 tablet by mouth daily.   Yes [provider]  nitroGLYCERIN (NITROSTAT) 0.4 MG SL tablet Place 0.4 mg under the tongue every 5 (five) minutes as needed for chest pain.   Yes [provider]  Vitamin D, Ergocalciferol, (DRISDOL) 50000 units CAPS capsule Take 50,000 Units by mouth every 7 (seven) days. Monday Night   Yes [provider]  zolpidem (AMBIEN) 10 MG tablet Take 10  mg by mouth at bedtime as needed for sleep.   Yes [provider]  glucose blood test strip Use as instructed 12/12/16   Cassandria Anger, MD     Family History  Problem Relation Age of Onset  . Other Father   . Diabetes Brother     Social History   Socioeconomic History  . Marital status: Married    Spouse name: None  . Number of children: None  . Years of education: None  . Highest education  level: None  Social Needs  . Financial resource strain: None  . Food insecurity - worry: None  . Food insecurity - inability: None  . Transportation needs - medical: None  . Transportation needs - non-medical: None  Occupational History  . None  Tobacco Use  . Smoking status: Never Smoker  . Smokeless tobacco: Never Used  Substance and Sexual Activity  . Alcohol use: Yes    Comment: occ  . Drug use: No  . Sexual activity: Yes  Other Topics Concern  . None  Social History Narrative  . None    Review of Systems: A 12 point ROS discussed and pertinent positives are indicated in the HPI above.  All other systems are negative.  Review of Systems  Constitutional: Positive for activity change, fatigue and unexpected weight change. Negative for fever.  Respiratory: Positive for shortness of breath.   Gastrointestinal: Positive for abdominal pain and nausea.  Musculoskeletal: Positive for back pain and gait problem.  Neurological: Positive for weakness.  Psychiatric/Behavioral: Negative for behavioral problems and confusion.    Vital Signs: BP (!) 133/93   Pulse (!) 113   Temp 98.5 F (36.9 C) (Oral)   Resp 16   Ht 6' (1.829 m)   Wt 199 lb (90.3 kg)   SpO2 95%   BMI 26.99 kg/m   Physical Exam  Constitutional: He is oriented to person, place, and time.  Cardiovascular: Normal rate, regular rhythm and normal heart sounds.  Pulmonary/Chest: Effort normal and breath sounds normal.  Abdominal: Soft. Bowel sounds are normal. There is tenderness.  Musculoskeletal: Normal range of motion.  Neurological: He is alert and oriented to person, place, and time.  Skin: Skin is warm and dry.  Psychiatric: He has a normal mood and affect. His behavior is normal. Judgment and thought content normal.  Nursing note and vitals reviewed.   Imaging: Ct Soft Tissue Neck W Contrast  Result Date: 04/19/2017 CLINICAL DATA:  61 y/o M; 15, loss of appetite, weight loss. Neck masses. EXAM: CT  NECK WITH CONTRAST TECHNIQUE: Multidetector CT imaging of the neck was performed using the standard protocol following the bolus administration of intravenous contrast. CONTRAST:  123m ISOVUE-300 IOPAMIDOL (ISOVUE-300) INJECTION 61% COMPARISON:  None. FINDINGS: Pharynx and larynx: Normal. No mass or swelling. Salivary glands: No inflammation, mass, or stone. Thyroid: Normal. Lymph nodes: Left supraclavicular and left Vb lymphadenopathy. Supraclavicular adenopathy measures 4.0 x 4.1 x 2.7 cm (AP x ML x CC series 8, image 112 and series 12, image 60). Vascular: Negative. Limited intracranial: Negative. Visualized orbits: Negative. Mastoids and visualized paranasal sinuses: Clear. 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 Multifactorial approximately moderate canal stenosis at C4-5 through C6-7 levels. Uncovertebral and facet hypertrophy encroaches on the right-sided neural foramen from C2 through T1 and left neural foramen at C3-C7. Upper chest: Negative. Other: None. IMPRESSION: Left supraclavicular and left Vb lymphadenopathy likely representing metastatic or lymphoproliferative disease. Supraclavicular adenopathy measures up to 4.1 cm.  Electronically Signed   By: Kristine Garbe M.D.   On: 04/19/2017 20:10   Ct Chest W Contrast  Result Date: 04/19/2017 CLINICAL DATA:  Fatigue and weight loss. EXAM: CT CHEST, ABDOMEN, AND PELVIS WITH CONTRAST TECHNIQUE: Multidetector CT imaging of the chest, abdomen and pelvis was performed following the standard protocol during bolus administration of intravenous contrast. CONTRAST:  133m ISOVUE-300 IOPAMIDOL (ISOVUE-300) INJECTION 61% COMPARISON:  None. FINDINGS: CT CHEST FINDINGS Cardiovascular: Mild coronary artery calcifications are seen. The thoracic aorta is nonaneurysmal with no dissection. The pulmonary arteries are normal in appearance. Mediastinum/Nodes: Adenopathy is seen in the base of the neck on the left. The  CT of the neck delineates the nodes to full extent. The largest measures 4 x 4.1 cm on series 3, image 14. Numerous nodes are identified including posteriorly on series 3, image 10. No adenopathy in the axilla. A mildly prominent node to the left of the trachea on series 3, image 18 measures 8 mm. It is not enlarged by CT criteria but suspicious. No other adenopathy in the mediastinum or hila. No effusions. The thyroid and esophagus are normal. Lungs/Pleura: Central airways are normal. No pulmonary nodules, masses, or infiltrates. Musculoskeletal: See below. CT ABDOMEN PELVIS FINDINGS Hepatobiliary: Numerous masses are seen in the right and left hepatic lobes consistent with metastatic disease. Most of the masses are in the right hepatic lobe. A represent mass on series 3, image 72 measures 9.2 by 5 cm. Cholelithiasis is identified. The portal vein is patent. The splenic vein is not seen along its entire course. Pancreas: There is a mass in the pancreatic body measuring 3.7 by 4.4 cm on series 3, image 72. The mass extends posteriorly, encasing the distal celiac artery. It encases the left gastric and proximal splenic arteries as well with narrowing. It likely encases the proximal hepatic artery. The mass does not encase the SMA or SMV. The pancreatic tail is somewhat atrophic. The pancreatic head neck are unremarkable. Spleen: A low-attenuation lesion in the anterior spleen on series 3, image 70 is subtle and nonspecific. Statistically, this is most likely benign measuring 7 mm. The spleen is otherwise normal. 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 Renal cysts are noted with no suspicious renal masses or obstruction. The ureters are unremarkable. Thickening of the bladder wall may be due to poor distention. Recommend clinical correlation. Stomach/Bowel: The stomach and small bowel are normal. The colon and appendix are normal. Vascular/Lymphatic: The abdominal  aorta is nonaneurysmal as are the iliac vessels. Retroperitoneal adenopathy is identified. There is an aortocaval node on series 3, image 94 measuring 15 mm. Multiple left periaortic nodes are identified. Adenopathy extends into the left common iliac chain. No adenopathy elsewhere in the pelvis. Shotty nodes in the porta hepatis are suspicious. Reproductive: Prostate is unremarkable. Other: There is a soft tissue mass posterior to the right iliac bone as seen on series 3, images 129 and 130. This mass measures at least 8.3 cm. There is periosteal reaction in the adjacent iliac bone. A fat containing a umbilical hernia is identified. No peritoneal or omental disease is noted on today's study. Musculoskeletal: There is periosteal reaction of the right iliac bone where it abuts the adjacent soft tissue mass described above. No other acute bony abnormalities are identified. IMPRESSION: 1. 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 The malignancy encases the distal celiac artery as well as the proximal portions of the  splenic, left gastric, and hepatic arteries. There is narrowing of the left gastric and splenic arteries with poor visualization of the splenic artery distally. There is also poor visualization of the left splenic vein distally. Metastatic disease is seen in the base of the neck on the left, the liver, the retroperitoneum, and the left common iliac chain. The soft tissue mass abutting the right iliac bone with periosteal reaction is consistent with a metastatic disease is well. There are shotty nodes in the porta hepatis which are also suspicious. There is a left paratracheal node which is indeterminate but suspicious. Electronically Signed   By: Dorise Bullion III M.D   On: 04/19/2017 20:47   Nm Bone Scan Whole Body  Result Date: 04/26/2017 CLINICAL DATA:  RIGHT hip pain for 3 months, elevated alkaline phosphatase, weight loss, diabetes mellitus,  hypertension, newly discovered pancreatic mass EXAM: NUCLEAR MEDICINE WHOLE BODY BONE SCAN TECHNIQUE: Whole body anterior and posterior images were obtained approximately 3 hours after intravenous injection of radiopharmaceutical. RADIOPHARMACEUTICALS:  20 mCi Technetium-8mMDP IV COMPARISON:  None Radiographic correlation:  CT neck chest abdomen pelvis 04/19/2017 FINDINGS: Mild uptake at the [facility] bilaterally typically degenerative. Focal area of abnormal increased osseous tracer accumulation within RIGHT ilium superior to the acetabulum, corresponding to periosteal changes present adjacent to a mass in the RIGHT gluteus medius muscle on prior CT question soft tissue metastasis with adjacent bone changes. Subtle increased tracer localization within the upper thoracic spine at approximately T2 vertebral body, corresponding to a poorly defined lytic lesion on CT (series 7, image 304). No additional abnormal osseous tracer accumulation is identified. Perineal urinary contamination. Urinary tract and soft tissue distribution of tracer unremarkable. IMPRESSION: Abnormal increased tracer localization in the RIGHT iliac bone, cranial to above the RIGHT acetabulum, corresponding to periosteal changes adjacent to a soft tissue mass in the RIGHT gluteus medius muscle. Finding is most consistent with a soft tissue metastasis with adjacent bone reaction and likely represents etiology of patient's RIGHT hip pain. Additional osseous metastasis to the T2 vertebral body. No additional abnormal osseous tracer accumulation identified. Electronically Signed   By: MLavonia DanaM.D.   On: 04/26/2017 14:26   Ct Abdomen Pelvis W Contrast  Result Date: 04/19/2017 CLINICAL DATA:  Fatigue and weight loss. EXAM: CT CHEST, ABDOMEN, AND PELVIS WITH CONTRAST TECHNIQUE: Multidetector CT imaging of the chest, abdomen and pelvis was performed following the standard protocol during bolus administration of intravenous contrast. CONTRAST:   1067mISOVUE-300 IOPAMIDOL (ISOVUE-300) INJECTION 61% COMPARISON:  None. FINDINGS: CT CHEST FINDINGS Cardiovascular: Mild coronary artery calcifications are seen. The thoracic aorta is nonaneurysmal with no dissection. The pulmonary arteries are normal in appearance. Mediastinum/Nodes: Adenopathy is seen in the base of the neck on the left. The CT of the neck delineates the nodes to full extent. The largest measures 4 x 4.1 cm on series 3, image 14. Numerous nodes are identified including posteriorly on series 3, image 10. No adenopathy in the axilla. A mildly prominent node to the left of the trachea on series 3, image 18 measures 8 mm. It is not enlarged by CT criteria but suspicious. No other adenopathy in the mediastinum or hila. No effusions. The thyroid and esophagus are normal. Lungs/Pleura: Central airways are normal. No pulmonary nodules, masses, or infiltrates. Musculoskeletal: See below. CT ABDOMEN PELVIS FINDINGS Hepatobiliary: Numerous masses are seen in the right and left hepatic lobes consistent with metastatic disease. Most of the masses are in the right hepatic lobe. A represent  mass on series 3, image 72 measures 9.2 by 5 cm. Cholelithiasis is identified. The portal vein is patent. The splenic vein is not seen along its entire course. Pancreas: There is a mass in the pancreatic body measuring 3.7 by 4.4 cm on series 3, image 72. The mass extends posteriorly, encasing the distal celiac artery. It encases the left gastric and proximal splenic arteries as well with narrowing. It likely encases the proximal hepatic artery. The mass does not encase the SMA or SMV. The pancreatic tail is somewhat atrophic. The pancreatic head neck are unremarkable. Spleen: A low-attenuation lesion in the anterior spleen on series 3, image 70 is subtle and nonspecific. Statistically, this is most likely benign measuring 7 mm. The spleen is otherwise normal. 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 Renal cysts are noted with no suspicious renal masses or obstruction. The ureters are unremarkable. Thickening of the bladder wall may be due to poor distention. Recommend clinical correlation. Stomach/Bowel: The stomach and small bowel are normal. The colon and appendix are normal. Vascular/Lymphatic: The abdominal aorta is nonaneurysmal as are the iliac vessels. Retroperitoneal adenopathy is identified. There is an aortocaval node on series 3, image 94 measuring 15 mm. Multiple left periaortic nodes are identified. Adenopathy extends into the left common iliac chain. No adenopathy elsewhere in the pelvis. Shotty nodes in the porta hepatis are suspicious. Reproductive: Prostate is unremarkable. Other: There is a soft tissue mass posterior to the right iliac bone as seen on series 3, images 129 and 130. This mass measures at least 8.3 cm. There is periosteal reaction in the adjacent iliac bone. A fat containing a umbilical hernia is identified. No peritoneal or omental disease is noted on today's study. Musculoskeletal: There is periosteal reaction of the right iliac bone where it abuts the adjacent soft tissue mass described above. No other acute bony abnormalities are identified. IMPRESSION: 1. 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 The malignancy encases the distal celiac artery as well as the proximal portions of the splenic, left gastric, and hepatic arteries. There is narrowing of the left gastric and splenic arteries with poor visualization of the splenic artery distally. There is also poor visualization of the left splenic vein distally. Metastatic disease is seen in the base of the neck on the left, the liver, the retroperitoneum, and the left common iliac chain. The soft tissue mass abutting the right iliac bone with periosteal reaction is consistent with a metastatic disease is well. There are shotty nodes in the porta  hepatis which are also suspicious. There is a left paratracheal node which is indeterminate but suspicious. Electronically Signed   By: Dorise Bullion III M.D   On: 04/19/2017 20:47    Labs:  CBC: Recent Labs    04/19/17 1739 05/13/17 0740  WBC 11.6* 15.2*  HGB 12.6* 12.8*  HCT 39.5 39.1  PLT 297 327    COAGS: Recent Labs    05/02/17 1531 05/13/17 0740  INR 1.15 1.24  APTT  --  33    BMP: Recent Labs    04/15/17 1156 04/19/17 1739  NA 137 137  K 4.7 3.3*  CL 99 101  CO2 31 27  GLUCOSE 207* 179*  BUN 17 18  CALCIUM 9.6 9.3  CREATININE 0.60* 0.68  GFRNONAA  --  >60  GFRAA  --  >60    LIVER FUNCTION TESTS: Recent Labs    04/19/17  1739  BILITOT 0.8  AST 34  ALT 41  ALKPHOS 183*  PROT 7.2  ALBUMIN 3.3*    TUMOR MARKERS: No results for input(s): AFPTM, CEA, CA199, CHROMGRNA in the last 8760 hours.  Assessment and Plan:  Pancreatic mass-Presumed pancreatic cancer Liver; Lymphadenopathy; bone lesions Scheduled for L SCLN biopsy Risks and benefits discussed with the patient including, but not limited to bleeding, infection, damage to adjacent structures or low yield requiring additional tests. All of the patient's questions were answered, patient is agreeable to proceed. Consent signed and in chart.  Thank you for this interesting consult.  I greatly enjoyed meeting Alexander Avila and look forward to participating in their care.  A copy of this report was sent to the requesting provider on this date.  Electronically Signed: Lavonia Drafts, PA-C 05/13/2017, 8:35 AM   I spent a total of  40 Minutes   in face to face in clinical consultation, greater than 50% of which was counseling/coordinating care for L SCLN bx

## 2017-05-13 NOTE — Procedures (Signed)
US guided core biopsy of left supraclavicular mass/lymph nodes.  5 cores obtained.  No immediate complication.  Minimal blood loss.

## 2017-05-17 ENCOUNTER — Encounter (HOSPITAL_COMMUNITY): Payer: Self-pay | Admitting: Adult Health

## 2017-05-17 NOTE — Progress Notes (Signed)
Unable to see path results in Epic from (L) supraclavicular lymph node biopsy performed on 05/13/17.  Spoke with Dr. Martinique with pathology. 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 Less likely to be colon cancer.   Formal pathology report to be faxed to [facility] today per Dr. Martinique.  Will leave these results for Dr. Lebron Conners, as he is scheduled to see patient for initial oncology consultation on Monday, 05/20/17.   Mike Craze, NP [place] [phone]

## 2017-05-20 ENCOUNTER — Telehealth (HOSPITAL_COMMUNITY): Payer: Self-pay | Admitting: *Deleted

## 2017-05-20 ENCOUNTER — Other Ambulatory Visit: Payer: Self-pay | Admitting: Oncology

## 2017-05-20 ENCOUNTER — Encounter (HOSPITAL_COMMUNITY): Payer: Self-pay | Admitting: Hematology and Oncology

## 2017-05-20 ENCOUNTER — Other Ambulatory Visit: Payer: Self-pay

## 2017-05-20 ENCOUNTER — Encounter (HOSPITAL_BASED_OUTPATIENT_CLINIC_OR_DEPARTMENT_OTHER): Payer: BLUE CROSS/BLUE SHIELD | Admitting: Hematology and Oncology

## 2017-05-20 VITALS — BP 116/83 | HR 125 | Temp 98.0°F | Resp 20 | Ht 72.0 in | Wt 199.4 lb

## 2017-05-20 DIAGNOSIS — C251 Malignant neoplasm of body of pancreas: Secondary | ICD-10-CM

## 2017-05-20 DIAGNOSIS — K8689 Other specified diseases of pancreas: Secondary | ICD-10-CM

## 2017-05-20 DIAGNOSIS — C7989 Secondary malignant neoplasm of other specified sites: Secondary | ICD-10-CM

## 2017-05-20 DIAGNOSIS — C787 Secondary malignant neoplasm of liver and intrahepatic bile duct: Secondary | ICD-10-CM | POA: Diagnosis not present

## 2017-05-20 DIAGNOSIS — C259 Malignant neoplasm of pancreas, unspecified: Secondary | ICD-10-CM

## 2017-05-20 DIAGNOSIS — C778 Secondary and unspecified malignant neoplasm of lymph nodes of multiple regions: Secondary | ICD-10-CM

## 2017-05-20 DIAGNOSIS — C772 Secondary and unspecified malignant neoplasm of intra-abdominal lymph nodes: Secondary | ICD-10-CM

## 2017-05-20 DIAGNOSIS — C7951 Secondary malignant neoplasm of bone: Secondary | ICD-10-CM | POA: Diagnosis not present

## 2017-05-20 MED ORDER — DIAZEPAM 5 MG PO TABS: 5.0000 mg | ORAL_TABLET | Freq: Four times a day (QID) | ORAL | 0 refills | Status: AC | PRN

## 2017-05-20 MED ORDER — CYCLOBENZAPRINE HCL 10 MG PO TABS: 10.0000 mg | ORAL_TABLET | Freq: Three times a day (TID) | ORAL | 1 refills | Status: AC | PRN

## 2017-05-20 MED ORDER — HYDROMORPHONE HCL 2 MG PO TABS: ORAL_TABLET | ORAL | 0 refills | Status: AC

## 2017-05-20 NOTE — Telephone Encounter (Signed)
Patient had Dilaudid and Flexeril refilled on 05/20/17 by Rulon Abide NP.

## 2017-05-20 NOTE — Progress Notes (Signed)
Refilled Dilaudid 2 mg tab q 4 hours PRN and Flexeril 10 mg tablet PO TID for muscle spasms.   Checked Narcotic registry. Ok to refill.   Patient will see Dr. Lebron Conners this afternoon for assessment.  Faythe Casa, NP 05/20/2017 9:23 AM

## 2017-05-22 MED ORDER — MORPHINE SULFATE ER 15 MG PO TBCR: 15.0000 mg | EXTENDED_RELEASE_TABLET | Freq: Two times a day (BID) | ORAL | 0 refills | Status: AC

## 2017-05-23 ENCOUNTER — Telehealth (HOSPITAL_COMMUNITY): Payer: Self-pay

## 2017-05-23 NOTE — Telephone Encounter (Signed)
Called to schedule picc placement, left message for pt to return call. AW

## 2017-05-24 ENCOUNTER — Emergency Department (HOSPITAL_COMMUNITY)
Admission: EM | Admit: 2017-05-24 | Discharge: 2017-05-24 | Disposition: A | Discharge status: Home / Self Care | Payer: BLUE CROSS/BLUE SHIELD | Attending: Emergency Medicine | Admitting: Emergency Medicine

## 2017-05-24 ENCOUNTER — Other Ambulatory Visit: Payer: Self-pay

## 2017-05-24 ENCOUNTER — Encounter (HOSPITAL_COMMUNITY): Payer: Self-pay | Admitting: Emergency Medicine

## 2017-05-24 DIAGNOSIS — R2242 Localized swelling, mass and lump, left lower limb: Secondary | ICD-10-CM | POA: Diagnosis not present

## 2017-05-24 DIAGNOSIS — Z5321 Procedure and treatment not carried out due to patient leaving prior to being seen by health care provider: Secondary | ICD-10-CM | POA: Insufficient documentation

## 2017-05-24 NOTE — ED Triage Notes (Signed)
Pt with metastatic pancreatic cancer This afternoon pt noted to have swollen L ankle and foot

## 2017-05-24 NOTE — ED Triage Notes (Signed)
Pt's wife states she is not going to wait any longer despite being told it would only be 10 minutes and we would get him back.  Pt's wife wheeled him out and would not discuss any further.

## 2017-05-28 DIAGNOSIS — C787 Secondary malignant neoplasm of liver and intrahepatic bile duct: Secondary | ICD-10-CM | POA: Insufficient documentation

## 2017-05-28 DIAGNOSIS — C7951 Secondary malignant neoplasm of bone: Secondary | ICD-10-CM | POA: Insufficient documentation

## 2017-05-28 DIAGNOSIS — C772 Secondary and unspecified malignant neoplasm of intra-abdominal lymph nodes: Secondary | ICD-10-CM | POA: Insufficient documentation

## 2017-05-28 DIAGNOSIS — C251 Malignant neoplasm of body of pancreas: Secondary | ICD-10-CM | POA: Insufficient documentation

## 2017-05-28 MED ORDER — LOPERAMIDE HCL 2 MG PO TABS: ORAL_TABLET | ORAL | 1 refills | Status: DC

## 2017-05-28 MED ORDER — PROCHLORPERAZINE MALEATE 10 MG PO TABS: 10.0000 mg | ORAL_TABLET | Freq: Four times a day (QID) | ORAL | 1 refills | Status: DC | PRN

## 2017-05-28 MED ORDER — LIDOCAINE-PRILOCAINE 2.5-2.5 % EX CREA: TOPICAL_CREAM | CUTANEOUS | 3 refills | Status: DC

## 2017-05-28 MED ORDER — DEXAMETHASONE 4 MG PO TABS: 8.0000 mg | ORAL_TABLET | Freq: Every day | ORAL | 5 refills | Status: DC

## 2017-05-28 MED ORDER — ONDANSETRON HCL 8 MG PO TABS: 8.0000 mg | ORAL_TABLET | Freq: Two times a day (BID) | ORAL | 1 refills | Status: DC | PRN

## 2017-05-28 MED ORDER — LORAZEPAM 1 MG PO TABS: 1.0000 mg | ORAL_TABLET | Freq: Four times a day (QID) | ORAL | 0 refills | Status: DC | PRN

## 2017-05-28 NOTE — Progress Notes (Signed)
START ON PATHWAY REGIMEN - Pancreatic     A cycle is every 14 days:     Oxaliplatin      Leucovorin      Irinotecan      5-Fluorouracil      5-Fluorouracil   **Always confirm dose/schedule in your pharmacy ordering system**    Patient Characteristics: Adenocarcinoma, Metastatic Disease, First Line, PS = 0, 1 Histology: Adenocarcinoma Current evidence of distant metastases<= Yes AJCC T Category: [place] AJCC N Category: NX AJCC M Category: M1 AJCC 8 Stage Grouping: IV Line of Therapy: First Line Would you be surprised if this patient died  in the next year<= I would NOT be surprised if this patient died in the next year Intent of Therapy: Non-Curative / Palliative Intent, Discussed with Patient

## 2017-05-28 NOTE — Progress Notes (Signed)
[name] Follow-up Visit:  Assessment: Cancer of pancreas, body ([place]) 62 y.o. 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  Clearly advanced disease associated with significant abdominal pain and lower cervical pain at this time.  Diagnosis of metastatic pancreatic malignancy is gram with survival without treatment measuring weeks in this case and survival with aggressive treatment potentially exceeding 12 months.  Patient had excellent performance status prior to becoming sick with a malignancy.  In this case I do recommend aggressive approach to treatment with upfront systemic therapy with FOLFIRINOX which has the highest potential of inducing response, although direct comparison is with gemcitabine-based therapies have not been conducted.  At this time, patient and his wife are interested in obtaining a second opinion.  They already have arranged a visit with [facility] in [place] scheduled next week.  I do support seeking second opinion, although patient could have obtained closer to home as we have 3 excellent edematous douches nearby which may have access to clinical trials for his disease.  Plan: -- Consult interventional radiology for Infuse-a-Port placement -- Patient will contact us once she has been seen at [facility] regarding whether he will be receiving treatment there or here locally.  If he chooses to be treated by asked, we will proceed with FOLFIRINOX ASAP as his performance status will likely be deteriorating rapidly without treatment.  Voice recognition software was used and creation of this note. Despite my best effort at editing the text, some misspelling/errors may have occurred.  Orders Placed This Encounter  Procedures  . IR Fluoro Guide CV Line Left     Standing Status:   Future    Standing Expiration Date:   07/19/2018    Order Specific Question:   Reason for Exam (SYMPTOM  OR DIAGNOSIS REQUIRED)    Answer:   chemotherapy administration    Order Specific Question:   Preferred Imaging Location?    Answer:   [facility]    Cancer Staging Cancer of pancreas, body [facility]) Staging form: Exocrine Pancreas, AJCC 8th Edition - Clinical stage from 05/20/2017: Stage IV (cT3, cN2, pM1) - Signed by Ardath Sax, MD on 05/28/2017   All questions were answered. . The patient knows to call the clinic with any problems, questions or concerns.  This note was electronically signed.    History of Presenting Illness Alexander Avila 62 y.o. presenting to the [facility] for new diagnosis of adenocarcinoma of the body of the pancreas metastatic to the liver, skeletal structures, subcutaneous soft tissues, and supraclavicular lymph nodes.  Patient returns to the clinic after undergoing a biopsy of the mass at the base of the neck that confirmed the diagnosis showing presence of an adenocarcinoma with cells staining positive for CK 7 and weeks CDX 2, and negative for CK 20, TTF-1, Napsin-A with the picture most consistent with upper gastrointestinal versus pancreatic  Primary.  At this time, patient has no new complaints compared to the previous visit in the clinic.  Continues to have abdominal discomfort, denies active nausea, or vomiting.  Oncological/hematological History:  No history exists.    Medical History: Past Medical History:  Diagnosis Date  . Chronic back pain   . Diabetes mellitus, type II (Alexander Avila)   . Heart attack ([place])   . Hip pain   . Hypercholesteremia   .  Hypertension   . Sciatica     Surgical History: Past Surgical History:  Procedure Laterality Date  . stents      Family History: Family History  Problem Relation Age of Onset  . Other Father   . Diabetes Brother     Social History: Social History    Socioeconomic History  . Marital status: Married    Spouse name: Not on file  . Number of children: Not on file  . Years of education: Not on file  . Highest education level: Not on file  Social Needs  . Financial resource strain: Not on file  . Food insecurity - worry: Not on file  . Food insecurity - inability: Not on file  . Transportation needs - medical: Not on file  . Transportation needs - non-medical: Not on file  Occupational History  . Not on file  Tobacco Use  . Smoking status: Never Smoker  . Smokeless tobacco: Never Used  Substance and Sexual Activity  . Alcohol use: Yes    Comment: occ  . Drug use: No  . Sexual activity: Yes  Other Topics Concern  . Not on file  Social History Narrative  . Not on file    Allergies: No Known Allergies  Medications:  Current Outpatient Medications  Medication Sig Dispense Refill  . aspirin 325 MG tablet Take 325 mg by mouth daily.      Marland Kitchen atorvastatin (LIPITOR) 20 MG tablet Take 1 tablet by mouth daily.    . cyclobenzaprine (FLEXERIL) 10 MG tablet Take 1 tablet (10 mg total) by mouth 3 (three) times daily as needed for muscle spasms. 30 tablet 1  . dronabinol (MARINOL) 2.5 MG capsule Take 2.5 mg by mouth 2 (two) times daily before lunch and supper.     . escitalopram (LEXAPRO) 20 MG tablet Take 1 tablet by mouth daily.    Marland Kitchen glucose blood test strip Use as instructed 150 each 2  . HYDROmorphone (DILAUDID) 2 MG tablet Take 1 tab every 4 hours as needed for severe pain. 45 tablet 0  . ibuprofen (ADVIL,MOTRIN) 200 MG tablet Take 800 mg by mouth every 8 (eight) hours as needed for moderate pain.     . metFORMIN (GLUCOPHAGE) 1000 MG tablet Take 1 tablet by mouth 2 (two) times daily with a meal.     . Multiple Vitamin (ONE-A-DAY MENS PO) Take 1 tablet by mouth daily.    . nitroGLYCERIN (NITROSTAT) 0.4 MG SL tablet Place 0.4 mg under the tongue every 5 (five) minutes as needed for chest pain.    . Vitamin D, Ergocalciferol,  (DRISDOL) 50000 units CAPS capsule Take 50,000 Units by mouth every 7 (seven) days. Monday Night    . zolpidem (AMBIEN) 10 MG tablet Take 10 mg by mouth at bedtime as needed for sleep.    . diazepam (VALIUM) 5 MG tablet Take 1 tablet (5 mg total) by mouth every 6 (six) hours as needed for anxiety or muscle spasms. 50 tablet 0  . morphine (MS CONTIN) 15 MG 12 hr tablet Take 1 tablet (15 mg total) by mouth every 12 (twelve) hours. 60 tablet 0   No current facility-administered medications for this visit.     Review of Systems: Review of Systems  All other systems reviewed and are negative.    PHYSICAL EXAMINATION Blood pressure 116/83, pulse (!) 125, temperature 98 F (36.7 C), temperature source Oral, resp. rate 20, height 6' (1.829 m), weight 199 lb 6.4 oz (90.4 kg), SpO2  98 %.  ECOG PERFORMANCE STATUS: 2 - Symptomatic, <50% confined to bed  Physical Exam  Constitutional: He is oriented to person, place, and time and well-developed, well-nourished, and in no distress. No distress.  HENT:  Head: Normocephalic and atraumatic.  Mouth/Throat: Oropharynx is clear and moist. No oropharyngeal exudate.  Eyes: Conjunctivae and EOM are normal. Pupils are equal, round, and reactive to light. No scleral icterus.  Neck: No thyromegaly present.  Cardiovascular: Normal rate, regular rhythm and normal heart sounds.  No murmur heard. Pulmonary/Chest: Effort normal and breath sounds normal. No respiratory distress. He has no wheezes.  Abdominal: Soft. Bowel sounds are normal. He exhibits distension. He exhibits no mass. There is no tenderness. There is no rebound.  Musculoskeletal: He exhibits edema.  Lymphadenopathy:    He has no cervical adenopathy.  Neurological: He is alert and oriented to person, place, and time. He has normal reflexes. No cranial nerve deficit.  Skin: Skin is warm and dry. He is not diaphoretic. No erythema.     LABORATORY DATA: I have personally reviewed the data as  listed: No visits with results within 1 Week(s) from this visit.  Latest known visit with results is:  Hospital Outpatient Visit on 05/13/2017  Component Date Value Ref Range Status  . aPTT 05/13/2017 33  24 - 36 seconds Final  . WBC 05/13/2017 15.2* 4.0 - 10.5 K/uL Final  . RBC 05/13/2017 4.43  4.22 - 5.81 MIL/uL Final  . Hemoglobin 05/13/2017 12.8* 13.0 - 17.0 g/dL Final  . HCT 05/13/2017 39.1  39.0 - 52.0 % Final  . MCV 05/13/2017 88.3  78.0 - 100.0 fL Final  . MCH 05/13/2017 28.9  26.0 - 34.0 pg Final  . MCHC 05/13/2017 32.7  30.0 - 36.0 g/dL Final  . RDW 05/13/2017 13.7  11.5 - 15.5 % Final  . Platelets 05/13/2017 327  150 - 400 K/uL Final  . Prothrombin Time 05/13/2017 15.5* 11.4 - 15.2 seconds Final  . INR 05/13/2017 1.24   Final  . Glucose-Capillary 05/13/2017 155* 65 - 99 mg/dL Final       Ardath Sax, MD

## 2017-05-28 NOTE — Assessment & Plan Note (Signed)
62 y.o. 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  Clearly advanced disease associated with significant abdominal pain and lower cervical pain at this time.  Diagnosis of metastatic pancreatic malignancy is gram with survival without treatment measuring weeks in this case and survival with aggressive treatment potentially exceeding 12 months.  Patient had excellent performance status prior to becoming sick with a malignancy.  In this case I do recommend aggressive approach to treatment with upfront systemic therapy with FOLFIRINOX which has the highest potential of inducing response, although direct comparison is with gemcitabine-based therapies have not been conducted.  At this time, patient and his wife are interested in obtaining a second opinion.  They already have arranged a visit with [facility] in [place] scheduled next week.  I do support seeking second opinion, although patient could have obtained closer to home as we have 3 excellent edematous douches nearby which may have access to clinical trials for his disease.  Plan: -- Consult interventional radiology for Infuse-a-Port placement -- Patient will contact us once she has been seen at [facility] regarding whether he will be receiving treatment there or here locally.  If he chooses to be treated by asked, we will proceed with FOLFIRINOX ASAP as his performance status will likely be deteriorating rapidly without treatment.

## 2017-05-29 ENCOUNTER — Other Ambulatory Visit (HOSPITAL_COMMUNITY): Payer: BLUE CROSS/BLUE SHIELD

## 2017-05-29 ENCOUNTER — Telehealth (HOSPITAL_COMMUNITY): Payer: Self-pay

## 2017-05-29 NOTE — Telephone Encounter (Signed)
Called to schedule picc, left message for pt to return call if he wants to schedule. AW

## 2017-05-30 ENCOUNTER — Inpatient Hospital Stay (HOSPITAL_COMMUNITY): Payer: BLUE CROSS/BLUE SHIELD

## 2017-06-03 ENCOUNTER — Telehealth (HOSPITAL_COMMUNITY): Payer: Self-pay | Admitting: *Deleted

## 2017-06-03 ENCOUNTER — Inpatient Hospital Stay (HOSPITAL_COMMUNITY): Payer: BLUE CROSS/BLUE SHIELD | Attending: Hematology and Oncology | Admitting: Hematology and Oncology

## 2017-06-03 ENCOUNTER — Ambulatory Visit (HOSPITAL_COMMUNITY): Payer: BLUE CROSS/BLUE SHIELD

## 2017-06-03 NOTE — Telephone Encounter (Signed)
Spoke with the pt's daughter-in-law, she stated that the pt is currently out of town being treated at the [facility]. I advised her to let the pt and his wife know that if there is anything we can do to call us and let us know.

## 2017-06-04 ENCOUNTER — Ambulatory Visit: Payer: BLUE CROSS/BLUE SHIELD | Admitting: "Endocrinology

## 2017-06-04 DIAGNOSIS — C259 Malignant neoplasm of pancreas, unspecified: Secondary | ICD-10-CM | POA: Diagnosis not present

## 2017-06-05 ENCOUNTER — Encounter (HOSPITAL_COMMUNITY): Payer: BLUE CROSS/BLUE SHIELD

## 2017-06-17 DIAGNOSIS — C259 Malignant neoplasm of pancreas, unspecified: Secondary | ICD-10-CM | POA: Diagnosis not present

## 2017-06-24 ENCOUNTER — Other Ambulatory Visit: Payer: Self-pay | Admitting: Family Medicine

## 2017-07-02 DIAGNOSIS — C259 Malignant neoplasm of pancreas, unspecified: Secondary | ICD-10-CM | POA: Diagnosis not present

## 2017-07-05 DIAGNOSIS — C259 Malignant neoplasm of pancreas, unspecified: Secondary | ICD-10-CM | POA: Diagnosis not present

## 2017-07-16 DIAGNOSIS — C259 Malignant neoplasm of pancreas, unspecified: Secondary | ICD-10-CM | POA: Diagnosis not present

## 2017-07-19 DIAGNOSIS — E46 Unspecified protein-calorie malnutrition: Secondary | ICD-10-CM | POA: Diagnosis not present

## 2017-07-19 DIAGNOSIS — R634 Abnormal weight loss: Secondary | ICD-10-CM | POA: Diagnosis not present

## 2017-07-19 DIAGNOSIS — C259 Malignant neoplasm of pancreas, unspecified: Secondary | ICD-10-CM | POA: Diagnosis not present

## 2017-07-20 DIAGNOSIS — E46 Unspecified protein-calorie malnutrition: Secondary | ICD-10-CM | POA: Diagnosis not present

## 2017-07-20 DIAGNOSIS — C259 Malignant neoplasm of pancreas, unspecified: Secondary | ICD-10-CM | POA: Diagnosis not present

## 2017-07-20 DIAGNOSIS — R634 Abnormal weight loss: Secondary | ICD-10-CM | POA: Diagnosis not present

## 2017-07-22 DIAGNOSIS — C259 Malignant neoplasm of pancreas, unspecified: Secondary | ICD-10-CM | POA: Diagnosis not present

## 2017-07-22 DIAGNOSIS — E46 Unspecified protein-calorie malnutrition: Secondary | ICD-10-CM | POA: Diagnosis not present

## 2017-07-22 DIAGNOSIS — R634 Abnormal weight loss: Secondary | ICD-10-CM | POA: Diagnosis not present

## 2017-07-30 DIAGNOSIS — C259 Malignant neoplasm of pancreas, unspecified: Secondary | ICD-10-CM | POA: Diagnosis not present

## 2017-08-02 DIAGNOSIS — C259 Malignant neoplasm of pancreas, unspecified: Secondary | ICD-10-CM | POA: Diagnosis not present

## 2017-08-13 DIAGNOSIS — C259 Malignant neoplasm of pancreas, unspecified: Secondary | ICD-10-CM | POA: Diagnosis not present

## 2017-08-14 DIAGNOSIS — E46 Unspecified protein-calorie malnutrition: Secondary | ICD-10-CM | POA: Diagnosis not present

## 2017-08-14 DIAGNOSIS — R634 Abnormal weight loss: Secondary | ICD-10-CM | POA: Diagnosis not present

## 2017-08-14 DIAGNOSIS — C259 Malignant neoplasm of pancreas, unspecified: Secondary | ICD-10-CM | POA: Diagnosis not present

## 2017-08-20 DIAGNOSIS — C259 Malignant neoplasm of pancreas, unspecified: Secondary | ICD-10-CM | POA: Diagnosis not present

## 2017-08-20 DIAGNOSIS — E46 Unspecified protein-calorie malnutrition: Secondary | ICD-10-CM | POA: Diagnosis not present

## 2017-08-20 DIAGNOSIS — R634 Abnormal weight loss: Secondary | ICD-10-CM | POA: Diagnosis not present

## 2017-08-27 DIAGNOSIS — C259 Malignant neoplasm of pancreas, unspecified: Secondary | ICD-10-CM | POA: Diagnosis not present

## 2017-09-02 DIAGNOSIS — C259 Malignant neoplasm of pancreas, unspecified: Secondary | ICD-10-CM | POA: Diagnosis not present

## 2017-09-10 DIAGNOSIS — E46 Unspecified protein-calorie malnutrition: Secondary | ICD-10-CM | POA: Diagnosis not present

## 2017-09-10 DIAGNOSIS — R634 Abnormal weight loss: Secondary | ICD-10-CM | POA: Diagnosis not present

## 2017-09-10 DIAGNOSIS — C259 Malignant neoplasm of pancreas, unspecified: Secondary | ICD-10-CM | POA: Diagnosis not present

## 2017-09-19 DIAGNOSIS — R634 Abnormal weight loss: Secondary | ICD-10-CM | POA: Diagnosis not present

## 2017-09-19 DIAGNOSIS — E46 Unspecified protein-calorie malnutrition: Secondary | ICD-10-CM | POA: Diagnosis not present

## 2017-09-19 DIAGNOSIS — C259 Malignant neoplasm of pancreas, unspecified: Secondary | ICD-10-CM | POA: Diagnosis not present

## 2017-09-25 DIAGNOSIS — C259 Malignant neoplasm of pancreas, unspecified: Secondary | ICD-10-CM | POA: Diagnosis not present

## 2017-10-04 DIAGNOSIS — R634 Abnormal weight loss: Secondary | ICD-10-CM | POA: Diagnosis not present

## 2017-10-04 DIAGNOSIS — E46 Unspecified protein-calorie malnutrition: Secondary | ICD-10-CM | POA: Diagnosis not present

## 2017-10-04 DIAGNOSIS — C259 Malignant neoplasm of pancreas, unspecified: Secondary | ICD-10-CM | POA: Diagnosis not present

## 2017-10-20 DIAGNOSIS — E46 Unspecified protein-calorie malnutrition: Secondary | ICD-10-CM | POA: Diagnosis not present

## 2017-10-20 DIAGNOSIS — C259 Malignant neoplasm of pancreas, unspecified: Secondary | ICD-10-CM | POA: Diagnosis not present

## 2017-10-20 DIAGNOSIS — R634 Abnormal weight loss: Secondary | ICD-10-CM | POA: Diagnosis not present

## 2017-11-14 DIAGNOSIS — E46 Unspecified protein-calorie malnutrition: Secondary | ICD-10-CM | POA: Diagnosis not present

## 2017-11-14 DIAGNOSIS — C259 Malignant neoplasm of pancreas, unspecified: Secondary | ICD-10-CM | POA: Diagnosis not present

## 2017-11-14 DIAGNOSIS — R634 Abnormal weight loss: Secondary | ICD-10-CM | POA: Diagnosis not present

## 2017-11-19 DIAGNOSIS — C259 Malignant neoplasm of pancreas, unspecified: Secondary | ICD-10-CM | POA: Diagnosis not present

## 2017-11-19 DIAGNOSIS — E46 Unspecified protein-calorie malnutrition: Secondary | ICD-10-CM | POA: Diagnosis not present

## 2017-11-19 DIAGNOSIS — R634 Abnormal weight loss: Secondary | ICD-10-CM | POA: Diagnosis not present

## 2017-12-20 DIAGNOSIS — C259 Malignant neoplasm of pancreas, unspecified: Secondary | ICD-10-CM | POA: Diagnosis not present

## 2017-12-20 DIAGNOSIS — R634 Abnormal weight loss: Secondary | ICD-10-CM | POA: Diagnosis not present

## 2017-12-20 DIAGNOSIS — E46 Unspecified protein-calorie malnutrition: Secondary | ICD-10-CM | POA: Diagnosis not present

## 2018-01-19 DEATH — deceased

## 2018-01-20 DIAGNOSIS — R634 Abnormal weight loss: Secondary | ICD-10-CM | POA: Diagnosis not present

## 2018-01-20 DIAGNOSIS — C259 Malignant neoplasm of pancreas, unspecified: Secondary | ICD-10-CM | POA: Diagnosis not present

## 2018-01-20 DIAGNOSIS — E46 Unspecified protein-calorie malnutrition: Secondary | ICD-10-CM | POA: Diagnosis not present

## 2018-09-21 IMAGING — NM NM BONE WHOLE BODY
4 series · 4 of 4 positions shown · non-contrast
Comparison: None

Radiographic correlation:  CT neck chest abdomen pelvis 04/19/2017

CLINICAL DATA: RIGHT hip pain for 3 months, elevated alkaline
phosphatase, weight loss, diabetes mellitus, hypertension, newly
discovered pancreatic mass

EXAM:
NUCLEAR MEDICINE WHOLE BODY BONE SCAN
TECHNIQUE: Whole body anterior and posterior images were obtained approximately
3 hours after intravenous injection of radiopharmaceutical.
RADIOPHARMACEUTICALS:  20 mCi Sechnetium-44m MDP IV

[Series 1: wbr_bone_60 whole body · 2.66mm/px · 1 of 1 slices shown (1 of 2)]
[im 1/1]
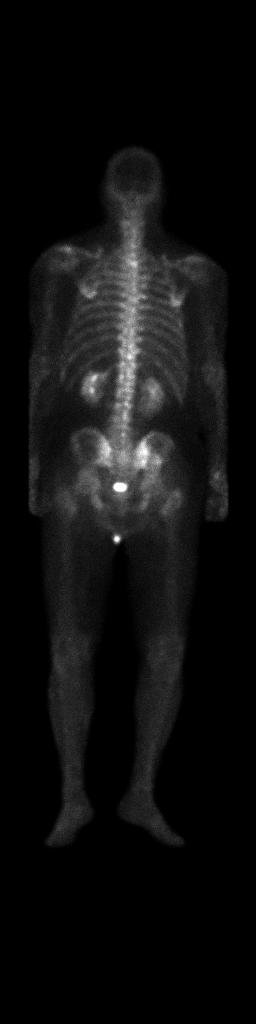

[Series 1: whole body · 2.66mm/px · 1 of 1 slices shown (1 of 2)]
[im 1/1]
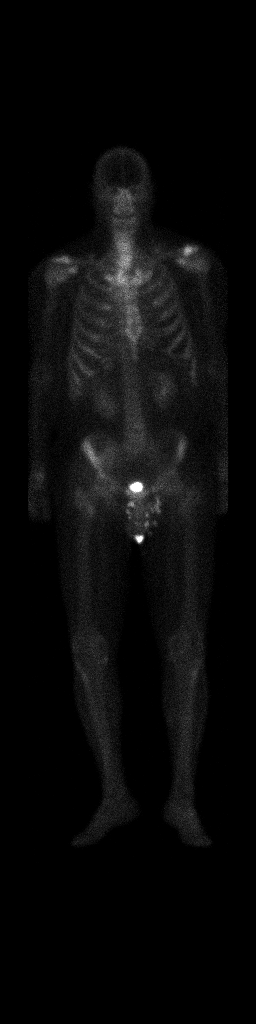

[Series 1: whole body · 2.66mm/px · 1 of 1 slices shown (2 of 2)]
[im 1/1]
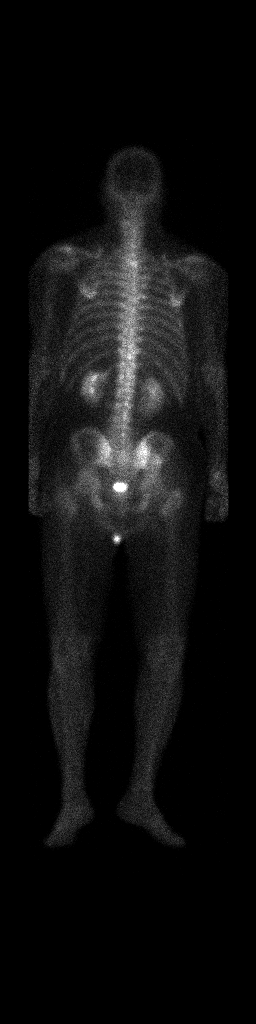

[Series 1: wbr_bone_60 whole body · 2.66mm/px · 1 of 1 slices shown (2 of 2)]
[im 1/1]
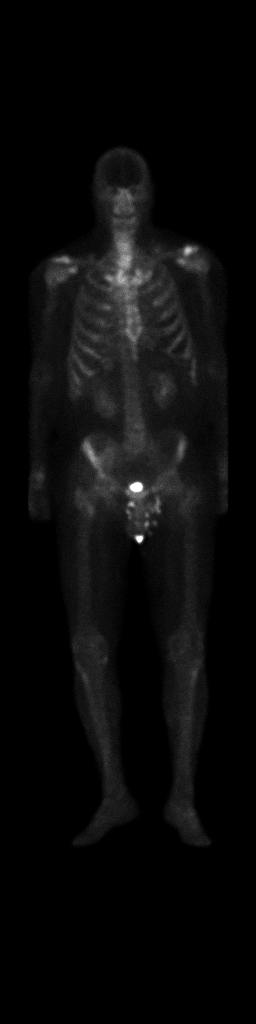

[4 of 4 positions shown; findings below may reference images not displayed]

FINDINGS: Mild uptake at the AC joints bilaterally typically degenerative.

Focal area of abnormal increased osseous tracer accumulation within
RIGHT ilium superior to the acetabulum, corresponding to periosteal
changes present adjacent to a mass in the RIGHT gluteus medius
muscle on prior CT question soft tissue metastasis with adjacent
bone changes.

Subtle increased tracer localization within the upper thoracic spine
at approximately T2 vertebral body, corresponding to a poorly
defined lytic lesion on CT (series 7, image 304).

No additional abnormal osseous tracer accumulation is identified.

Perineal urinary contamination.

Urinary tract and soft tissue distribution of tracer unremarkable.
IMPRESSION: Abnormal increased tracer localization in the RIGHT iliac bone,
cranial to above the RIGHT acetabulum, corresponding to periosteal
changes adjacent to a soft tissue mass in the RIGHT gluteus medius
muscle.

Finding is most consistent with a soft tissue metastasis with
adjacent bone reaction and likely represents etiology of patient's
RIGHT hip pain.

Additional osseous metastasis to the T2 vertebral body.

No additional abnormal osseous tracer accumulation identified.
